# Patient Record
Sex: Male | Born: 1955 | Race: Black or African American | Hispanic: No | Marital: Single | Smoking: Never smoker
Health system: Southern US, Community
[De-identification: ages and names within clinical notes are randomized; demographics above are authoritative.]

## PROBLEM LIST (undated history)

## (undated) DIAGNOSIS — M1990 Unspecified osteoarthritis, unspecified site: Secondary | ICD-10-CM

## (undated) DIAGNOSIS — Z01810 Encounter for preprocedural cardiovascular examination: Secondary | ICD-10-CM

## (undated) DIAGNOSIS — E559 Vitamin D deficiency, unspecified: Secondary | ICD-10-CM

## (undated) DIAGNOSIS — E119 Type 2 diabetes mellitus without complications: Secondary | ICD-10-CM

## (undated) DIAGNOSIS — E042 Nontoxic multinodular goiter: Secondary | ICD-10-CM

## (undated) DIAGNOSIS — I480 Paroxysmal atrial fibrillation: Secondary | ICD-10-CM

## (undated) DIAGNOSIS — E139 Other specified diabetes mellitus without complications: Secondary | ICD-10-CM

## (undated) DIAGNOSIS — IMO0002 Reserved for concepts with insufficient information to code with codable children: Secondary | ICD-10-CM

## (undated) DIAGNOSIS — Z8619 Personal history of other infectious and parasitic diseases: Secondary | ICD-10-CM

## (undated) DIAGNOSIS — I10 Essential (primary) hypertension: Secondary | ICD-10-CM

## (undated) DIAGNOSIS — C73 Malignant neoplasm of thyroid gland: Secondary | ICD-10-CM

## (undated) DIAGNOSIS — N189 Chronic kidney disease, unspecified: Secondary | ICD-10-CM

## (undated) DIAGNOSIS — E785 Hyperlipidemia, unspecified: Secondary | ICD-10-CM

## (undated) DIAGNOSIS — R9430 Abnormal result of cardiovascular function study, unspecified: Secondary | ICD-10-CM

## (undated) DIAGNOSIS — L80 Vitiligo: Secondary | ICD-10-CM

## (undated) DIAGNOSIS — E213 Hyperparathyroidism, unspecified: Secondary | ICD-10-CM

## (undated) DIAGNOSIS — I4891 Unspecified atrial fibrillation: Secondary | ICD-10-CM

## (undated) HISTORY — DX: Encounter for preprocedural cardiovascular examination: Z01.810

## (undated) HISTORY — DX: Personal history of other infectious and parasitic diseases: Z86.19

## (undated) HISTORY — DX: Malignant neoplasm of thyroid gland: C73

## (undated) HISTORY — DX: Hypercalcemia: E83.52

## (undated) HISTORY — DX: Unspecified osteoarthritis, unspecified site: M19.90

## (undated) HISTORY — DX: Hyperlipidemia, unspecified: E78.5

## (undated) HISTORY — DX: Paroxysmal atrial fibrillation: I48.0

## (undated) HISTORY — DX: Vitamin D deficiency, unspecified: E55.9

## (undated) HISTORY — DX: Unspecified atrial fibrillation: I48.91

## (undated) HISTORY — DX: Abnormal result of cardiovascular function study, unspecified: R94.30

## (undated) HISTORY — DX: Type 2 diabetes mellitus without complications: E11.9

## (undated) HISTORY — DX: Chronic kidney disease, unspecified: N18.9

## (undated) HISTORY — DX: Hyperparathyroidism, unspecified: E21.3

## (undated) HISTORY — DX: Vitiligo: L80

## (undated) HISTORY — DX: Reserved for concepts with insufficient information to code with codable children: IMO0002

## (undated) HISTORY — DX: Other specified diabetes mellitus without complications: E13.9

---

## 1997-08-27 ENCOUNTER — Inpatient Hospital Stay (HOSPITAL_COMMUNITY): Admission: AD | Admit: 1997-08-27 | Discharge: 1997-08-30 | Payer: Self-pay | Admitting: Nephrology

## 1997-09-16 ENCOUNTER — Other Ambulatory Visit: Admission: RE | Admit: 1997-09-16 | Discharge: 1997-09-16 | Payer: Self-pay | Admitting: Nephrology

## 1997-12-06 ENCOUNTER — Inpatient Hospital Stay (HOSPITAL_COMMUNITY): Admission: EM | Admit: 1997-12-06 | Discharge: 1997-12-11 | Payer: Self-pay | Admitting: Emergency Medicine

## 1997-12-06 ENCOUNTER — Encounter: Payer: Self-pay | Admitting: Nephrology

## 1997-12-08 ENCOUNTER — Encounter: Payer: Self-pay | Admitting: Nephrology

## 1998-01-15 ENCOUNTER — Ambulatory Visit: Admission: RE | Admit: 1998-01-15 | Discharge: 1998-01-15 | Payer: Self-pay | Admitting: Vascular Surgery

## 1998-06-16 ENCOUNTER — Ambulatory Visit (HOSPITAL_COMMUNITY): Admission: RE | Admit: 1998-06-16 | Discharge: 1998-06-16 | Payer: Self-pay | Admitting: Nephrology

## 1998-06-16 ENCOUNTER — Encounter: Payer: Self-pay | Admitting: Nephrology

## 1998-07-15 ENCOUNTER — Encounter: Payer: Self-pay | Admitting: Thoracic Surgery

## 1998-07-15 ENCOUNTER — Ambulatory Visit (HOSPITAL_COMMUNITY): Admission: RE | Admit: 1998-07-15 | Discharge: 1998-07-15 | Payer: Self-pay | Admitting: Thoracic Surgery

## 1998-12-18 ENCOUNTER — Ambulatory Visit (HOSPITAL_COMMUNITY): Admission: RE | Admit: 1998-12-18 | Discharge: 1998-12-18 | Payer: Self-pay | Admitting: Thoracic Surgery

## 1998-12-18 ENCOUNTER — Encounter: Payer: Self-pay | Admitting: Thoracic Surgery

## 2001-12-20 ENCOUNTER — Encounter: Admission: RE | Admit: 2001-12-20 | Discharge: 2001-12-20 | Payer: Self-pay | Admitting: Nephrology

## 2001-12-20 ENCOUNTER — Encounter: Payer: Self-pay | Admitting: Nephrology

## 2002-02-22 HISTORY — PX: KIDNEY TRANSPLANT: SHX239

## 2003-06-25 ENCOUNTER — Encounter: Admission: RE | Admit: 2003-06-25 | Discharge: 2003-06-25 | Payer: Self-pay | Admitting: Nephrology

## 2003-09-27 ENCOUNTER — Encounter: Admission: RE | Admit: 2003-09-27 | Discharge: 2003-09-27 | Payer: Self-pay | Admitting: Nephrology

## 2005-06-23 ENCOUNTER — Encounter: Payer: Self-pay | Admitting: Nephrology

## 2008-07-03 ENCOUNTER — Encounter: Admission: RE | Admit: 2008-07-03 | Discharge: 2008-07-03 | Payer: Self-pay | Admitting: Nephrology

## 2009-06-26 ENCOUNTER — Encounter: Admission: RE | Admit: 2009-06-26 | Discharge: 2009-06-26 | Payer: Self-pay | Admitting: Nephrology

## 2009-11-19 ENCOUNTER — Emergency Department (HOSPITAL_COMMUNITY): Admission: EM | Admit: 2009-11-19 | Discharge: 2009-11-19 | Payer: Self-pay | Admitting: Emergency Medicine

## 2010-04-08 ENCOUNTER — Other Ambulatory Visit: Payer: Self-pay | Admitting: Surgery

## 2010-04-08 DIAGNOSIS — E041 Nontoxic single thyroid nodule: Secondary | ICD-10-CM

## 2010-04-09 ENCOUNTER — Other Ambulatory Visit: Payer: Self-pay | Admitting: Surgery

## 2010-04-15 ENCOUNTER — Ambulatory Visit
Admission: RE | Admit: 2010-04-15 | Discharge: 2010-04-15 | Disposition: A | Discharge status: Home / Self Care | Payer: Medicare Other | Source: Ambulatory Visit | Attending: Surgery | Admitting: Surgery

## 2010-04-15 ENCOUNTER — Other Ambulatory Visit (HOSPITAL_COMMUNITY)
Admission: RE | Admit: 2010-04-15 | Discharge: 2010-04-15 | Disposition: A | Discharge status: Home / Self Care | Payer: Self-pay | Source: Ambulatory Visit | Attending: Interventional Radiology | Admitting: Interventional Radiology

## 2010-04-15 ENCOUNTER — Ambulatory Visit
Admission: RE | Admit: 2010-04-15 | Discharge: 2010-04-15 | Disposition: A | Discharge status: Home / Self Care | Payer: Self-pay | Source: Ambulatory Visit | Attending: Surgery | Admitting: Surgery

## 2010-04-15 DIAGNOSIS — E041 Nontoxic single thyroid nodule: Secondary | ICD-10-CM

## 2010-04-16 ENCOUNTER — Other Ambulatory Visit: Payer: Self-pay | Admitting: Surgery

## 2010-04-16 DIAGNOSIS — E042 Nontoxic multinodular goiter: Secondary | ICD-10-CM

## 2010-04-16 DIAGNOSIS — R229 Localized swelling, mass and lump, unspecified: Secondary | ICD-10-CM

## 2010-04-16 DIAGNOSIS — E049 Nontoxic goiter, unspecified: Secondary | ICD-10-CM

## 2010-04-21 ENCOUNTER — Other Ambulatory Visit: Payer: Medicare Other

## 2010-04-22 ENCOUNTER — Ambulatory Visit: Admission: RE | Admit: 2010-04-22 | Payer: Medicare Other | Source: Ambulatory Visit

## 2010-04-22 ENCOUNTER — Other Ambulatory Visit: Payer: Self-pay | Admitting: Interventional Radiology

## 2010-04-22 ENCOUNTER — Other Ambulatory Visit: Payer: Self-pay | Admitting: Surgery

## 2010-04-22 ENCOUNTER — Ambulatory Visit
Admission: RE | Admit: 2010-04-22 | Discharge: 2010-04-22 | Disposition: A | Discharge status: Home / Self Care | Payer: Medicare Other | Source: Ambulatory Visit | Attending: Surgery | Admitting: Surgery

## 2010-04-22 DIAGNOSIS — E041 Nontoxic single thyroid nodule: Secondary | ICD-10-CM

## 2010-04-27 ENCOUNTER — Ambulatory Visit
Admission: RE | Admit: 2010-04-27 | Discharge: 2010-04-27 | Disposition: A | Discharge status: Home / Self Care | Payer: Medicare Other | Source: Ambulatory Visit | Attending: Surgery | Admitting: Surgery

## 2010-04-27 DIAGNOSIS — R229 Localized swelling, mass and lump, unspecified: Secondary | ICD-10-CM

## 2010-04-27 DIAGNOSIS — E049 Nontoxic goiter, unspecified: Secondary | ICD-10-CM

## 2010-04-27 MED ORDER — GADOBENATE DIMEGLUMINE 529 MG/ML IV SOLN
16.0000 mL | Freq: Once | INTRAVENOUS | Status: AC | PRN
  Administered 2010-04-27: 16 mL via INTRAVENOUS

## 2011-03-01 ENCOUNTER — Encounter (INDEPENDENT_AMBULATORY_CARE_PROVIDER_SITE_OTHER): Payer: Self-pay

## 2011-05-05 DIAGNOSIS — I10 Essential (primary) hypertension: Secondary | ICD-10-CM | POA: Diagnosis not present

## 2011-05-05 DIAGNOSIS — Z940 Kidney transplant status: Secondary | ICD-10-CM | POA: Diagnosis not present

## 2011-05-05 DIAGNOSIS — E041 Nontoxic single thyroid nodule: Secondary | ICD-10-CM | POA: Diagnosis not present

## 2011-05-06 DIAGNOSIS — Z940 Kidney transplant status: Secondary | ICD-10-CM | POA: Diagnosis not present

## 2011-05-06 DIAGNOSIS — E785 Hyperlipidemia, unspecified: Secondary | ICD-10-CM | POA: Diagnosis not present

## 2011-05-06 DIAGNOSIS — E119 Type 2 diabetes mellitus without complications: Secondary | ICD-10-CM | POA: Diagnosis not present

## 2011-05-06 DIAGNOSIS — N2581 Secondary hyperparathyroidism of renal origin: Secondary | ICD-10-CM | POA: Diagnosis not present

## 2011-07-07 DIAGNOSIS — E041 Nontoxic single thyroid nodule: Secondary | ICD-10-CM | POA: Diagnosis not present

## 2011-08-05 ENCOUNTER — Encounter (INDEPENDENT_AMBULATORY_CARE_PROVIDER_SITE_OTHER): Payer: Self-pay | Admitting: Surgery

## 2011-08-05 ENCOUNTER — Ambulatory Visit (INDEPENDENT_AMBULATORY_CARE_PROVIDER_SITE_OTHER): Payer: Medicare Other | Admitting: Surgery

## 2011-08-05 VITALS — BP 102/60 | HR 45 | Temp 95.9°F | Resp 18 | Ht 74.0 in | Wt 186.0 lb

## 2011-08-05 DIAGNOSIS — D449 Neoplasm of uncertain behavior of unspecified endocrine gland: Secondary | ICD-10-CM | POA: Diagnosis not present

## 2011-08-05 DIAGNOSIS — D440 Neoplasm of uncertain behavior of thyroid gland: Secondary | ICD-10-CM | POA: Insufficient documentation

## 2011-08-05 NOTE — Patient Instructions (Signed)
[facility] [phone]  THYROID & PARATHYROID SURGERY -- POST OP INSTRUCTIONS  Always review your discharge instruction sheet from the facility where your surgery was performed.  1. A prescription for pain medication may be given to you upon discharge.  Take your pain medication as prescribed, if needed.  If narcotic pain medicine is not needed, then you may take acetaminophen (Tylenol) or ibuprofen (Advil) as needed. 2. Take your usually prescribed medications unless otherwise directed. 3. If you need a refill on your pain medication, please contact your pharmacy. They will contact our office to request authorization.  Prescriptions will not be processed after 5 pm or on weekends. 4. Start with a light diet upon arrival home, such as soup and crackers, etc.  Be sure to drink pleny of fluids daily.  Resume your normal diet the day after surgery. 5. Most patients will experience some swelling and bruising on the chest and neck area.  Ice packs will help.  Swelling and bruising can take several days to resolve.  6. It is common to experience some constipation if taking pain medication after surgery.  Increasing fluid intake and taking a stool softener will usually help or prevent this problem.  A mild laxative (Milk of Magnesia or Miralax) should be taken according to package directions if there are no bowel movements after 48 hours. 7. You may remove your bandages 24-48 hours after surgery, and you may shower at that time.  You have steri-strips (small skin tapes) in place directly over the incision.  These strips should be left on the skin for 7-10 days and then removed. 8. You may resume regular (light) daily activities beginning the next day-such as daily self-care, walking, climbing stairs-gradually increasing activities as tolerated.  You may have sexual intercourse when it is comfortable.  Refrain from any heavy lifting or straining until approved by your doctor.  You may drive  when you no longer are taking prescription pain medication, you can comfortably wear a seatbelt, and you can safely maneuver your car and apply brakes. 9. You should see your doctor in the office for a follow-up appointment approximately two weeks after your surgery.  Make sure that you call for this appointment within a day or two after you arrive home to insure a convenient appointment time.  WHEN TO CALL YOUR DOCTOR: 1. Fever over 101.5 2. Inability to urinate 3. Nausea and/or vomiting - persistent 4. Extreme swelling or bruising 5. Continued bleeding from incision 6. Increased pain, redness, or drainage from the incision 7. Difficulty swallowing or breathing 8. Muscle cramping or spasms 9. Numbness or tingling in hands or feet or around lips  The clinic staff is available to answer your questions during regular business hours.  Please don't hesitate to call and ask to speak to one of the nurses if you have concerns.  [email or web address]   

## 2011-08-05 NOTE — Progress Notes (Signed)
Chief Complaint  Patient presents with  . Thyroid Nodule    follicular nodule with atypia - referral from Dr. Balan and Dr. Dunham    HISTORY: Patient is a 56-year-old black male previously evaluated in my practice for a left-sided thyroid nodule. 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 In March 2012 the patient was recommended for total thyroidectomy. Patient delayed surgery. He was seen by his nephrologist to also recommended thyroidectomy. Patient was eventually referred to an endocrinologist who also recommended thyroidectomy. Patient now returns for followup and to discuss thyroid surgery.  Past Medical History  Diagnosis Date  . Chronic kidney disease     dialysis; kidney transplant     Current Outpatient Prescriptions  Medication Sig Dispense Refill  . amLODipine (NORVASC) 10 MG tablet Take 10 mg by mouth 2 (two) times daily.      . aspirin 81 MG tablet Take 81 mg by mouth daily.      . atorvastatin (LIPITOR) 80 MG tablet Take 80 mg by mouth daily.      . cloNIDine (CATAPRES) 0.3 MG tablet Take 0.3 mg by mouth 2 (two) times daily.      . cycloSPORINE modified (NEORAL) 100 MG capsule Take 100 mg by mouth 2 (two) times daily.      . enalapril (VASOTEC) 10 MG tablet Take 10 mg by mouth daily.      . mycophenolate (MYFORTIC) 360 MG TBEC Take 360 mg by mouth 2 (two) times daily.         Not on File   History reviewed. No pertinent family history.   History   Social History  . Marital Status: Single    Spouse Name: N/A    Number of Children: N/A  . Years of Education: N/A   Social History Main Topics  . Smoking status: Never Smoker   . Smokeless tobacco: None  . Alcohol Use: No  . Drug Use: No  . Sexually Active:    Other Topics Concern  . None   Social History Narrative  . None     REVIEW OF SYSTEMS - PERTINENT POSITIVES ONLY: Denies tremor. Denies palpitation. Denies  dysphagia. Denies new masses. Denies discomfort.  EXAM: Filed Vitals:   08/05/11 0933  BP: 102/60  Pulse: 45  Temp: 95.9 F (35.5 C)  Resp: 18    HEENT: normocephalic; pupils equal and reactive; sclerae clear; dentition good; mucous membranes moist NECK:  Palpable left thyroid nodule, approximately 2 cm, nontender, mobile with swallowing; right thyroid lobe nodular but without discrete or dominant mass; symmetric on extension; no palpable anterior or posterior cervical lymphadenopathy; no supraclavicular masses; no tenderness CHEST: clear to auscultation bilaterally without rales, rhonchi, or wheezes CARDIAC: regular rate and rhythm without significant murmur; peripheral pulses are full ABDOMEN: soft without distension; bowel sounds present; no mass; no hepatosplenomegaly; no hernia EXT:  non-tender without edema; no deformity; Vascular access left forearm NEURO: no gross focal deficits; no sign of tremor   LABORATORY RESULTS: See [facility] (CHL-Epic) for most recent results   RADIOLOGY RESULTS: See [facility] (CHL-Epic) for most recent results   IMPRESSION: Dominant left thyroid nodule, 2.6 cm, with 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 We reviewed the procedure of total thyroidectomy. We discussed specific risks and benefits including the risk of recurrent laryngeal nerve injury and injury to parathyroid glands. We discussed   the postoperative recovery to be anticipated. We discussed the need for lifelong thyroid hormone replacement. We discussed the potential need for radioactive iodine treatment. Patient understands and wishes to proceed.  The risks and benefits of the procedure have been discussed at length with the patient.  The patient understands the proposed procedure, potential alternative treatments, and the course of recovery to be expected.  All of  the patient's questions have been answered at this time.  The patient wishes to proceed with surgery.  Endrit Gittins M. Terriana Barreras, MD, FACS General & Endocrine Surgery [facility]   Visit Diagnoses: 1. Neoplasm of uncertain behavior of thyroid gland, left lobe     Primary Care Physician: DUNHAM,CYNTHIA B, MD  Endocrine:  Dr. Bindubal Balan  

## 2011-08-06 ENCOUNTER — Telehealth (INDEPENDENT_AMBULATORY_CARE_PROVIDER_SITE_OTHER): Payer: Self-pay | Admitting: General Surgery

## 2011-08-06 ENCOUNTER — Encounter (HOSPITAL_COMMUNITY)
Admission: RE | Admit: 2011-08-06 | Discharge: 2011-08-06 | Disposition: A | Discharge status: Home / Self Care | Payer: Medicare Other | Source: Ambulatory Visit | Attending: Surgery | Admitting: Surgery

## 2011-08-06 ENCOUNTER — Other Ambulatory Visit: Payer: Self-pay

## 2011-08-06 ENCOUNTER — Encounter (HOSPITAL_COMMUNITY): Payer: Self-pay

## 2011-08-06 ENCOUNTER — Encounter (HOSPITAL_COMMUNITY): Payer: Self-pay | Admitting: Pharmacy Technician

## 2011-08-06 ENCOUNTER — Ambulatory Visit (HOSPITAL_COMMUNITY)
Admission: RE | Admit: 2011-08-06 | Discharge: 2011-08-06 | Disposition: A | Discharge status: Home / Self Care | Payer: Medicare Other | Source: Ambulatory Visit | Attending: Surgery | Admitting: Surgery

## 2011-08-06 DIAGNOSIS — Z01818 Encounter for other preprocedural examination: Secondary | ICD-10-CM | POA: Insufficient documentation

## 2011-08-06 DIAGNOSIS — Z01810 Encounter for preprocedural cardiovascular examination: Secondary | ICD-10-CM | POA: Insufficient documentation

## 2011-08-06 DIAGNOSIS — Z01812 Encounter for preprocedural laboratory examination: Secondary | ICD-10-CM | POA: Diagnosis not present

## 2011-08-06 DIAGNOSIS — I4891 Unspecified atrial fibrillation: Secondary | ICD-10-CM | POA: Insufficient documentation

## 2011-08-06 DIAGNOSIS — E041 Nontoxic single thyroid nodule: Secondary | ICD-10-CM | POA: Insufficient documentation

## 2011-08-06 DIAGNOSIS — Z01811 Encounter for preprocedural respiratory examination: Secondary | ICD-10-CM | POA: Diagnosis not present

## 2011-08-06 HISTORY — DX: Nontoxic multinodular goiter: E04.2

## 2011-08-06 HISTORY — DX: Essential (primary) hypertension: I10

## 2011-08-06 LAB — CBC
HCT: 42.2 % (ref 39.0–52.0)
Hemoglobin: 14.1 g/dL (ref 13.0–17.0)
MCH: 29.1 pg (ref 26.0–34.0)
MCV: 87 fL (ref 78.0–100.0)
Platelets: 213 10*3/uL (ref 150–400)
RBC: 4.85 MIL/uL (ref 4.22–5.81)

## 2011-08-06 LAB — COMPREHENSIVE METABOLIC PANEL
BUN: 32 mg/dL — ABNORMAL HIGH (ref 6–23)
CO2: 22 mEq/L (ref 19–32)
Calcium: 10.5 mg/dL (ref 8.4–10.5)
Chloride: 107 mEq/L (ref 96–112)
Creatinine, Ser: 1.74 mg/dL — ABNORMAL HIGH (ref 0.50–1.35)
GFR calc Af Amer: 50 mL/min — ABNORMAL LOW (ref >90)
GFR calc non Af Amer: 43 mL/min — ABNORMAL LOW (ref >90)
Glucose, Bld: 96 mg/dL (ref 70–99)
Total Bilirubin: 0.4 mg/dL (ref 0.3–1.2)

## 2011-08-06 LAB — PROTIME-INR
INR: 0.95 (ref 0.00–1.49)
Prothrombin Time: 12.9 seconds (ref 11.6–15.2)

## 2011-08-06 NOTE — Pre-Procedure Instructions (Signed)
Reviewed with patient pre-op instructions using Teach back method. 

## 2011-08-06 NOTE — Pre-Procedure Instructions (Signed)
Talked with Randy [name] to let her know that Dr. Gerrit Friends needs to look at [facility] results

## 2011-08-06 NOTE — Pre-Procedure Instructions (Signed)
Spoke with Dr. Rica Mast and he wants CBC,CMET PT/INR,PTT CXR EKG done on patient for surgery based on patients history.

## 2011-08-06 NOTE — Pre-Procedure Instructions (Signed)
EKG from [facility] 11/09/2002 on chart, EKG from 12/18/1998 and 12/07/1997 from [facility] on chart.

## 2011-08-06 NOTE — Telephone Encounter (Signed)
Randy [name], pre-admission, calling to report pt's EKG shows Atrial Fib.  Per anesthesia, since this is a new cardiac rhythm, he will need cardiac clearance for surgery, currently scheduled for 08/17/11.

## 2011-08-06 NOTE — Patient Instructions (Addendum)
20 Randy [name]  08/06/2011   Your procedure is scheduled on:  Tuesday 08/17/2011 at 130pm  Report to [facility] at 1100 AM.  Call this number if you have problems the morning of surgery: [phone]   Remember:   Do not eat food:After Midnight.  May have clear liquids: up to 4 Hours before arrival-MAY HAVE CLEAR LIQUIDS FROM MIDNIGHT UP UNTIL 0730AM MORNING OF SURGERY THEN NOTHING UNTIL AFTER SURGERY!  Clear liquids include soda, tea, black coffee, apple or grape juice, broth.  Take these medicines the morning of surgery with A SIP OF WATER: Norvasc,Catapress,Cuclosporine,Mycophenolate   Do not wear jewelry  Do not wear lotions, powders, or perfumes.   Do not shave 48 hours prior to surgery. Men may shave face and neck.  Do not bring valuables to the hospital.  Contacts, dentures or bridgework may not be worn into surgery.  Leave suitcase in the car. After surgery it may be brought to your room.  For patients admitted to the hospital, checkout time is 11:00 AM the day of discharge.     Special Instructions: CHG Shower Use Special Wash: 1/2 bottle night before surgery and 1/2 bottle morning of surgery.   Please read over the following fact sheets that you were given: MRSA Information, Sleep apnea sheet, Incentive Spirometry sheet,                   If you have any questions ,please call me Telford Nab.Aleksia Freiman,RN,BSN at [phone]

## 2011-08-06 NOTE — Pre-Procedure Instructions (Signed)
Spoke with Dr. Acey Lav about EKG showing A-Fib. -wants patient to have Cardiac workup before surgery based on history.

## 2011-08-06 NOTE — Telephone Encounter (Signed)
Pt notified of abn EKG and need for cardiac clearance. Pt has never seen cardiologist. Appt made for 6-17 arrive at 11:45am with Dr Myrtis Ser at [facility] cardiology. Pt advised proceeding with his surgery is dependent on what Dr Myrtis Ser recommends.

## 2011-08-09 ENCOUNTER — Ambulatory Visit (INDEPENDENT_AMBULATORY_CARE_PROVIDER_SITE_OTHER): Payer: Medicare Other | Admitting: Cardiology

## 2011-08-09 ENCOUNTER — Encounter: Payer: Self-pay | Admitting: Cardiology

## 2011-08-09 VITALS — BP 130/68 | HR 73 | Resp 18 | Ht 74.0 in | Wt 188.8 lb

## 2011-08-09 DIAGNOSIS — E042 Nontoxic multinodular goiter: Secondary | ICD-10-CM

## 2011-08-09 DIAGNOSIS — I10 Essential (primary) hypertension: Secondary | ICD-10-CM

## 2011-08-09 DIAGNOSIS — N189 Chronic kidney disease, unspecified: Secondary | ICD-10-CM | POA: Insufficient documentation

## 2011-08-09 DIAGNOSIS — Z01810 Encounter for preprocedural cardiovascular examination: Secondary | ICD-10-CM | POA: Diagnosis not present

## 2011-08-09 DIAGNOSIS — D440 Neoplasm of uncertain behavior of thyroid gland: Secondary | ICD-10-CM

## 2011-08-09 DIAGNOSIS — D449 Neoplasm of uncertain behavior of unspecified endocrine gland: Secondary | ICD-10-CM

## 2011-08-09 NOTE — Patient Instructions (Addendum)
You are cleared for your upcoming surgery by Dr Gerrit Friends from a cardiac standpoint  No f/u scheduled with cardiology

## 2011-08-09 NOTE — Assessment & Plan Note (Signed)
The patient has had a renal transplant and is followed by Dr.Dunham. I do not know if any of his medications need to be adjusted for his surgery. He is on antirejection medicines including low-dose prednisone.

## 2011-08-09 NOTE — Assessment & Plan Note (Signed)
Blood pressures control. No change in therapy. 

## 2011-08-09 NOTE — Assessment & Plan Note (Signed)
The patient has no known cardiac disease. He is having no significant symptoms.His EKG reveals no diagnostic abnormalities. He had a complete cardiac workup in 2004 before his renal transplant. He has not had a recent MI and has no signs of heart failure. He has not had any significant arrhythmias. He is stable from the cardiac viewpoint. Further cardiac workup is not needed. He is cleared to have thyroid surgery.

## 2011-08-09 NOTE — Progress Notes (Signed)
HPI   The patient is seen today in consultation for cardiac clearance for him to have thyroid surgery. He is a pleasant and active 56 year old gentleman. 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 This was done in 2004. He had a full cardiac evaluation at that time and had no diagnoses of any significant cardiac disease ( per the patient). He has not had any significant cardiac symptoms over time. There has been no significant cardiac testing done over time since his renal transplant. He did some work this past Saturday and felt no particular problems. He's not having chest pain or shortness of breath.  No Known Allergies  Current Outpatient Prescriptions  Medication Sig Dispense Refill  . amLODipine (NORVASC) 10 MG tablet Take 10 mg by mouth 2 (two) times daily.      Marland Kitchen aspirin 81 MG tablet Take 81 mg by mouth daily.      Marland Kitchen atorvastatin (LIPITOR) 80 MG tablet Take 80 mg by mouth daily.      . cloNIDine (CATAPRES) 0.3 MG tablet Take 0.3 mg by mouth 2 (two) times daily.      . cycloSPORINE modified (NEORAL) 100 MG capsule Take 100 mg by mouth 2 (two) times daily.      . enalapril (VASOTEC) 5 MG tablet Take 5 mg by mouth at bedtime.       Marland Kitchen glipiZIDE (GLUCOTROL XL) 5 MG 24 hr tablet Take 5 mg by mouth daily.      . mycophenolate (MYFORTIC) 360 MG TBEC Take 720 mg by mouth 2 (two) times daily.       . predniSONE (DELTASONE) 5 MG tablet Take 5 mg by mouth daily.        History   Social History  . Marital Status: Single    Spouse Name: N/A    Number of Children: N/A  . Years of Education: N/A   Occupational History  . Not on file.   Social History Main Topics  . Smoking status: Never Smoker   . Smokeless tobacco: Not on file  . Alcohol Use: No  . Drug Use: No  . Sexually Active:    Other Topics Concern  . Not on file   Social History Narrative  . No narrative on file    No family history on file.  Past Medical History  Diagnosis Date  . Chronic kidney  disease     dialysis; kidney transplant  2004  . Hypertension   . Multiple thyroid nodules   . Preop cardiovascular exam     Preop clearance for thyroid surgery June, 2013    Past Surgical History  Procedure Date  . Kidney transplant 2004    ROS  Patient denies fever, chills, headache, sweats, rash, change in vision, change in hearing, chest pain, cough, nausea vomiting, urinary symptoms. All other systems are reviewed and are negative.  PHYSICAL EXAM  Patient is stable. He is oriented to person time and place. Affect is normal. There is no jugulovenous distention. There no carotid bruits. Lungs are clear. Respiratory effort is nonlabored. Cardiac exam reveals S1 and S2. There no clicks or significant murmurs. The abdomen is soft. There is no peripheral edema. There no musculoskeletal deformities. There are no skin rashes.  Filed Vitals:   08/09/11 1113  BP: 130/68  Pulse: 73  Resp: 18  Height: 6\' 2"  (1.88 m)  Weight: 188 lb 12.8 oz (85.639 kg)   EKG is done today and reviewed by me. There  are minor nonspecific ST-T wave changes.  ASSESSMENT & PLAN

## 2011-08-09 NOTE — Assessment & Plan Note (Signed)
Patient needs to have surgery for this. He is now cleared from the cardiac viewpoint.

## 2011-08-10 NOTE — Pre-Procedure Instructions (Signed)
Patient called and informed of new time of surgery -to report at Short Stay Dept.at [facility] at 0930 am -nothing to eat or drink after midnight and surgery is at 1200 noon-Reviewed pre-op instructions with patient using Teach Back method.

## 2011-08-11 ENCOUNTER — Other Ambulatory Visit (INDEPENDENT_AMBULATORY_CARE_PROVIDER_SITE_OTHER): Payer: Self-pay

## 2011-08-11 ENCOUNTER — Telehealth (INDEPENDENT_AMBULATORY_CARE_PROVIDER_SITE_OTHER): Payer: Self-pay

## 2011-08-11 DIAGNOSIS — C73 Malignant neoplasm of thyroid gland: Secondary | ICD-10-CM | POA: Diagnosis not present

## 2011-08-11 DIAGNOSIS — Z940 Kidney transplant status: Secondary | ICD-10-CM | POA: Diagnosis not present

## 2011-08-11 DIAGNOSIS — E559 Vitamin D deficiency, unspecified: Secondary | ICD-10-CM | POA: Diagnosis not present

## 2011-08-11 DIAGNOSIS — E785 Hyperlipidemia, unspecified: Secondary | ICD-10-CM | POA: Diagnosis not present

## 2011-08-11 DIAGNOSIS — E119 Type 2 diabetes mellitus without complications: Secondary | ICD-10-CM | POA: Diagnosis not present

## 2011-08-11 DIAGNOSIS — I10 Essential (primary) hypertension: Secondary | ICD-10-CM | POA: Diagnosis not present

## 2011-08-11 NOTE — Telephone Encounter (Signed)
Lab slip mailed to pt to go to [facility] for post op labs.

## 2011-08-17 ENCOUNTER — Encounter (HOSPITAL_COMMUNITY): Payer: Self-pay | Admitting: Anesthesiology

## 2011-08-17 ENCOUNTER — Ambulatory Visit (HOSPITAL_COMMUNITY)
Admission: RE | Admit: 2011-08-17 | Discharge: 2011-08-18 | Disposition: A | Discharge status: Home / Self Care | Payer: Medicare Other | Source: Ambulatory Visit | Attending: Surgery | Admitting: Surgery

## 2011-08-17 ENCOUNTER — Encounter (HOSPITAL_COMMUNITY)
Admission: RE | Disposition: A | Discharge status: Home / Self Care | Payer: Self-pay | Source: Ambulatory Visit | Attending: Surgery

## 2011-08-17 ENCOUNTER — Ambulatory Visit (HOSPITAL_COMMUNITY): Payer: Medicare Other | Admitting: Anesthesiology

## 2011-08-17 ENCOUNTER — Encounter (HOSPITAL_COMMUNITY): Payer: Self-pay | Admitting: *Deleted

## 2011-08-17 DIAGNOSIS — E041 Nontoxic single thyroid nodule: Principal | ICD-10-CM | POA: Insufficient documentation

## 2011-08-17 DIAGNOSIS — Z79899 Other long term (current) drug therapy: Secondary | ICD-10-CM | POA: Diagnosis not present

## 2011-08-17 DIAGNOSIS — Z01812 Encounter for preprocedural laboratory examination: Secondary | ICD-10-CM | POA: Diagnosis not present

## 2011-08-17 DIAGNOSIS — Z940 Kidney transplant status: Secondary | ICD-10-CM | POA: Diagnosis not present

## 2011-08-17 DIAGNOSIS — Z992 Dependence on renal dialysis: Secondary | ICD-10-CM | POA: Diagnosis not present

## 2011-08-17 DIAGNOSIS — N189 Chronic kidney disease, unspecified: Secondary | ICD-10-CM | POA: Diagnosis not present

## 2011-08-17 DIAGNOSIS — D449 Neoplasm of uncertain behavior of unspecified endocrine gland: Secondary | ICD-10-CM | POA: Diagnosis not present

## 2011-08-17 DIAGNOSIS — C73 Malignant neoplasm of thyroid gland: Secondary | ICD-10-CM | POA: Diagnosis not present

## 2011-08-17 DIAGNOSIS — D440 Neoplasm of uncertain behavior of thyroid gland: Secondary | ICD-10-CM

## 2011-08-17 HISTORY — PX: THYROIDECTOMY: SHX17

## 2011-08-17 LAB — GLUCOSE, CAPILLARY
Glucose-Capillary: 89 mg/dL (ref 70–99)
Glucose-Capillary: 158 mg/dL — ABNORMAL HIGH (ref 70–99)

## 2011-08-17 SURGERY — THYROIDECTOMY
Anesthesia: General | Site: Neck | Wound class: Clean

## 2011-08-17 MED ORDER — PROMETHAZINE HCL 25 MG/ML IJ SOLN: 6.2500 mg | INTRAMUSCULAR | Status: DC | PRN

## 2011-08-17 MED ORDER — LACTATED RINGERS IV SOLN
INTRAVENOUS | Status: DC
  Administered 2011-08-17: 1000 mL via INTRAVENOUS

## 2011-08-17 MED ORDER — LIDOCAINE HCL (CARDIAC) 20 MG/ML IV SOLN
INTRAVENOUS | Status: DC | PRN
  Administered 2011-08-17: 60 mg via INTRAVENOUS

## 2011-08-17 MED ORDER — CALCIUM CARBONATE 1250 (500 CA) MG PO TABS
1250.0000 mg | ORAL_TABLET | Freq: Three times a day (TID) | ORAL | Status: DC
  Administered 2011-08-17 (×2): 1250 mg via ORAL
  Filled 2011-08-17 (×5): qty 1

## 2011-08-17 MED ORDER — ACETAMINOPHEN 10 MG/ML IV SOLN
INTRAVENOUS | Status: DC | PRN
  Administered 2011-08-17: 1000 mg via INTRAVENOUS

## 2011-08-17 MED ORDER — HETASTARCH-ELECTROLYTES 6 % IV SOLN
INTRAVENOUS | Status: DC | PRN
  Administered 2011-08-17: 13:00:00 via INTRAVENOUS

## 2011-08-17 MED ORDER — ACETAMINOPHEN 10 MG/ML IV SOLN
INTRAVENOUS | Status: AC
  Filled 2011-08-17: qty 100

## 2011-08-17 MED ORDER — 0.9 % SODIUM CHLORIDE (POUR BTL) OPTIME
TOPICAL | Status: DC | PRN
  Administered 2011-08-17: 1000 mL

## 2011-08-17 MED ORDER — NEOSTIGMINE METHYLSULFATE 1 MG/ML IJ SOLN
INTRAMUSCULAR | Status: DC | PRN
  Administered 2011-08-17: 4 mg via INTRAVENOUS

## 2011-08-17 MED ORDER — ACETAMINOPHEN 325 MG PO TABS: 650.0000 mg | ORAL_TABLET | ORAL | Status: DC | PRN

## 2011-08-17 MED ORDER — CLONIDINE HCL 0.3 MG PO TABS
0.3000 mg | ORAL_TABLET | Freq: Two times a day (BID) | ORAL | Status: DC
  Administered 2011-08-17: 0.3 mg via ORAL
  Filled 2011-08-17 (×3): qty 1

## 2011-08-17 MED ORDER — HYDROCORTISONE SOD SUCCINATE 1000 MG IJ SOLR
INTRAMUSCULAR | Status: DC | PRN
  Administered 2011-08-17: 100 mg via INTRAVENOUS

## 2011-08-17 MED ORDER — PREDNISONE 5 MG PO TABS
5.0000 mg | ORAL_TABLET | Freq: Every day | ORAL | Status: DC
  Administered 2011-08-17: 5 mg via ORAL
  Filled 2011-08-17 (×2): qty 1

## 2011-08-17 MED ORDER — GLIPIZIDE ER 5 MG PO TB24
5.0000 mg | ORAL_TABLET | Freq: Every day | ORAL | Status: DC
  Administered 2011-08-17: 5 mg via ORAL
  Filled 2011-08-17 (×2): qty 1

## 2011-08-17 MED ORDER — CEFAZOLIN SODIUM 1-5 GM-% IV SOLN
1.0000 g | INTRAVENOUS | Status: AC
  Administered 2011-08-17: 2 g via INTRAVENOUS

## 2011-08-17 MED ORDER — ENALAPRIL MALEATE 5 MG PO TABS
5.0000 mg | ORAL_TABLET | Freq: Every day | ORAL | Status: DC
  Administered 2011-08-17: 5 mg via ORAL
  Filled 2011-08-17 (×2): qty 1

## 2011-08-17 MED ORDER — PHENYLEPHRINE HCL 10 MG/ML IJ SOLN
INTRAMUSCULAR | Status: DC | PRN
  Administered 2011-08-17 (×5): 80 ug via INTRAVENOUS

## 2011-08-17 MED ORDER — LACTATED RINGERS IV SOLN: INTRAVENOUS | Status: DC

## 2011-08-17 MED ORDER — CISATRACURIUM BESYLATE (PF) 10 MG/5ML IV SOLN
INTRAVENOUS | Status: DC | PRN
  Administered 2011-08-17: 6 mg via INTRAVENOUS
  Administered 2011-08-17 (×2): 4 mg via INTRAVENOUS
  Administered 2011-08-17: 2 mg via INTRAVENOUS

## 2011-08-17 MED ORDER — CEFAZOLIN SODIUM-DEXTROSE 2-3 GM-% IV SOLR
INTRAVENOUS | Status: AC
  Filled 2011-08-17: qty 50

## 2011-08-17 MED ORDER — HYDROCORTISONE SOD SUCCINATE 100 MG IJ SOLR
INTRAMUSCULAR | Status: AC
  Filled 2011-08-17: qty 2

## 2011-08-17 MED ORDER — MYCOPHENOLATE SODIUM 180 MG PO TBEC
720.0000 mg | DELAYED_RELEASE_TABLET | Freq: Two times a day (BID) | ORAL | Status: DC
  Administered 2011-08-17: 720 mg via ORAL
  Filled 2011-08-17 (×3): qty 4

## 2011-08-17 MED ORDER — MIDAZOLAM HCL 5 MG/5ML IJ SOLN
INTRAMUSCULAR | Status: DC | PRN
  Administered 2011-08-17: 2 mg via INTRAVENOUS

## 2011-08-17 MED ORDER — CYCLOSPORINE MODIFIED (NEORAL) 100 MG PO CAPS
100.0000 mg | ORAL_CAPSULE | Freq: Two times a day (BID) | ORAL | Status: DC
Start: 2011-08-17 — End: 2011-08-18
  Administered 2011-08-17: 100 mg via ORAL
  Filled 2011-08-17 (×3): qty 1

## 2011-08-17 MED ORDER — HYDROCODONE-ACETAMINOPHEN 5-325 MG PO TABS
1.0000 | ORAL_TABLET | ORAL | Status: DC | PRN
  Administered 2011-08-17: 1 via ORAL
  Administered 2011-08-18: 2 via ORAL
  Filled 2011-08-17: qty 1
  Filled 2011-08-17: qty 2

## 2011-08-17 MED ORDER — AMLODIPINE BESYLATE 10 MG PO TABS
10.0000 mg | ORAL_TABLET | Freq: Two times a day (BID) | ORAL | Status: DC
  Administered 2011-08-17: 10 mg via ORAL
  Filled 2011-08-17 (×3): qty 1

## 2011-08-17 MED ORDER — ONDANSETRON HCL 4 MG PO TABS: 4.0000 mg | ORAL_TABLET | Freq: Four times a day (QID) | ORAL | Status: DC | PRN

## 2011-08-17 MED ORDER — KCL IN DEXTROSE-NACL 20-5-0.45 MEQ/L-%-% IV SOLN
INTRAVENOUS | Status: DC
  Administered 2011-08-17 – 2011-08-18 (×2): via INTRAVENOUS
  Filled 2011-08-17 (×2): qty 1000

## 2011-08-17 MED ORDER — EPHEDRINE SULFATE 50 MG/ML IJ SOLN
INTRAMUSCULAR | Status: DC | PRN
  Administered 2011-08-17 (×2): 5 mg via INTRAVENOUS

## 2011-08-17 MED ORDER — HYDROMORPHONE HCL PF 1 MG/ML IJ SOLN
0.2500 mg | INTRAMUSCULAR | Status: DC | PRN
  Administered 2011-08-17 (×2): 0.5 mg via INTRAVENOUS

## 2011-08-17 MED ORDER — PROPOFOL 10 MG/ML IV EMUL
INTRAVENOUS | Status: DC | PRN
  Administered 2011-08-17: 180 mg via INTRAVENOUS

## 2011-08-17 MED ORDER — ONDANSETRON HCL 4 MG/2ML IJ SOLN: 4.0000 mg | Freq: Four times a day (QID) | INTRAMUSCULAR | Status: DC | PRN

## 2011-08-17 MED ORDER — LIDOCAINE HCL 4 % MT SOLN
OROMUCOSAL | Status: DC | PRN
  Administered 2011-08-17: 4 mL via TOPICAL

## 2011-08-17 MED ORDER — SODIUM CHLORIDE 0.9 % IV SOLN
INTRAVENOUS | Status: DC | PRN
  Administered 2011-08-17: 12:00:00 via INTRAVENOUS

## 2011-08-17 MED ORDER — GLYCOPYRROLATE 0.2 MG/ML IJ SOLN
INTRAMUSCULAR | Status: DC | PRN
  Administered 2011-08-17: 0.6 mg via INTRAVENOUS

## 2011-08-17 MED ORDER — HYDROMORPHONE HCL PF 1 MG/ML IJ SOLN
1.0000 mg | INTRAMUSCULAR | Status: DC | PRN
  Administered 2011-08-17 (×2): 1 mg via INTRAVENOUS
  Filled 2011-08-17 (×2): qty 1

## 2011-08-17 MED ORDER — ONDANSETRON HCL 4 MG/2ML IJ SOLN
INTRAMUSCULAR | Status: DC | PRN
  Administered 2011-08-17: 4 mg via INTRAVENOUS

## 2011-08-17 MED ORDER — SUFENTANIL CITRATE 50 MCG/ML IV SOLN
INTRAVENOUS | Status: DC | PRN
  Administered 2011-08-17: 10 ug via INTRAVENOUS
  Administered 2011-08-17: 5 ug via INTRAVENOUS

## 2011-08-17 MED ORDER — HYDROMORPHONE HCL PF 1 MG/ML IJ SOLN
INTRAMUSCULAR | Status: AC
  Filled 2011-08-17: qty 1

## 2011-08-17 MED ORDER — SUCCINYLCHOLINE CHLORIDE 20 MG/ML IJ SOLN
INTRAMUSCULAR | Status: DC | PRN
  Administered 2011-08-17: 100 mg via INTRAVENOUS

## 2011-08-17 SURGICAL SUPPLY — 38 items
ATTRACTOMAT 16X20 MAGNETIC DRP (DRAPES) ×2 IMPLANT
BENZOIN TINCTURE PRP APPL 2/3 (GAUZE/BANDAGES/DRESSINGS) ×2 IMPLANT
BLADE HEX COATED 2.75 (ELECTRODE) ×2 IMPLANT
BLADE SURG 15 STRL LF DISP TIS (BLADE) ×1 IMPLANT
BLADE SURG 15 STRL SS (BLADE) ×1
CANISTER SUCTION 2500CC (MISCELLANEOUS) ×2 IMPLANT
CHLORAPREP W/TINT 10.5 ML (MISCELLANEOUS) ×2 IMPLANT
CLIP TI MEDIUM 6 (CLIP) ×6 IMPLANT
CLIP TI WIDE RED SMALL 6 (CLIP) ×6 IMPLANT
CLOTH BEACON ORANGE TIMEOUT ST (SAFETY) ×2 IMPLANT
DISSECTOR ROUND CHERRY 3/8 STR (MISCELLANEOUS) IMPLANT
DRAPE PED LAPAROTOMY (DRAPES) ×2 IMPLANT
DRESSING SURGICEL FIBRLLR 1X2 (HEMOSTASIS) ×1 IMPLANT
DRSG SURGICEL FIBRILLAR 1X2 (HEMOSTASIS) ×2
ELECT REM PT RETURN 9FT ADLT (ELECTROSURGICAL) ×2
ELECTRODE REM PT RTRN 9FT ADLT (ELECTROSURGICAL) ×1 IMPLANT
GAUZE SPONGE 4X4 16PLY XRAY LF (GAUZE/BANDAGES/DRESSINGS) ×2 IMPLANT
GLOVE SURG ORTHO 8.0 STRL STRW (GLOVE) ×2 IMPLANT
GOWN STRL NON-REIN LRG LVL3 (GOWN DISPOSABLE) ×2 IMPLANT
GOWN STRL REIN XL XLG (GOWN DISPOSABLE) ×4 IMPLANT
KIT BASIN OR (CUSTOM PROCEDURE TRAY) ×2 IMPLANT
NS IRRIG 1000ML POUR BTL (IV SOLUTION) ×2 IMPLANT
PACK BASIC VI WITH GOWN DISP (CUSTOM PROCEDURE TRAY) ×2 IMPLANT
PENCIL BUTTON HOLSTER BLD 10FT (ELECTRODE) ×2 IMPLANT
SHEARS HARMONIC 9CM CVD (BLADE) ×2 IMPLANT
SPONGE GAUZE 4X4 12PLY (GAUZE/BANDAGES/DRESSINGS) ×2 IMPLANT
STAPLER VISISTAT 35W (STAPLE) ×2 IMPLANT
STRIP CLOSURE SKIN 1/2X4 (GAUZE/BANDAGES/DRESSINGS) ×2 IMPLANT
SUT MNCRL AB 4-0 PS2 18 (SUTURE) ×2 IMPLANT
SUT SILK 2 0 (SUTURE) ×1
SUT SILK 2-0 18XBRD TIE 12 (SUTURE) ×1 IMPLANT
SUT SILK 3 0 (SUTURE)
SUT SILK 3-0 18XBRD TIE 12 (SUTURE) IMPLANT
SUT VIC AB 3-0 SH 18 (SUTURE) ×2 IMPLANT
SYR BULB IRRIGATION 50ML (SYRINGE) ×2 IMPLANT
TAPE CLOTH SURG 4X10 WHT LF (GAUZE/BANDAGES/DRESSINGS) ×2 IMPLANT
TOWEL OR 17X26 10 PK STRL BLUE (TOWEL DISPOSABLE) ×2 IMPLANT
YANKAUER SUCT BULB TIP 10FT TU (MISCELLANEOUS) ×2 IMPLANT

## 2011-08-17 NOTE — Brief Op Note (Signed)
08/17/2011  2:02 PM  PATIENT:  Randy [name]  56 y.o. male  PRE-OPERATIVE DIAGNOSIS:  atypical thyroid nodule   POST-OPERATIVE DIAGNOSIS:  atypical thyroid nodule   PROCEDURE:  Total thyroidectomy  SURGEON:  Surgeon(s) and Role:    * Velora Heckler, MD - Primary  ANESTHESIA:   general  EBL:  Total I/O In: 1050 [I.V.:1050] Out: 20 [Blood:20]  BLOOD ADMINISTERED:none  DRAINS: none   LOCAL MEDICATIONS USED:  NONE  SPECIMEN:  Excision  DISPOSITION OF SPECIMEN:  PATHOLOGY  COUNTS:  YES  TOURNIQUET:  * No tourniquets in log *  DICTATION: .Other Dictation: Dictation Number [id number]  PLAN OF CARE: Admit for overnight observation  PATIENT DISPOSITION:  PACU - hemodynamically stable.   Delay start of Pharmacological VTE agent (>24hrs) due to surgical blood loss or risk of bleeding: yes  Velora Heckler, MD, FACS General & Endocrine Surgery [facility] Office: [phone]

## 2011-08-17 NOTE — Interval H&P Note (Signed)
History and Physical Interval Note:  08/17/2011 12:06 PM  Randy [name]  has presented today for surgery, with the diagnosis of atypical thyroid nodule.   The various methods of treatment have been discussed with the patient and family. After consideration of risks, benefits and other options for treatment, the patient has consented to    Procedure(s) (LRB): THYROIDECTOMY (N/A) as a surgical intervention .    The patient's history has been reviewed, patient examined, no change in status, stable for surgery.  I have reviewed the patients' chart and labs.  Questions were answered to the patient's satisfaction.    Velora Heckler, MD, FACS General & Endocrine Surgery [facility] Office: [phone]  Dawsyn Ramsaran Judie Petit

## 2011-08-17 NOTE — Anesthesia Preprocedure Evaluation (Signed)
Anesthesia Evaluation  Patient identified by MRN, date of birth, ID band Patient awake    Reviewed: Allergy & Precautions, H&P , NPO status , Patient's Chart, lab work & pertinent test results  Airway Mallampati: II TM Distance: >3 FB Neck ROM: Full    Dental  (+) Edentulous Upper, Poor Dentition and Dental Advisory Given   Pulmonary neg pulmonary ROS,  breath sounds clear to auscultation  Pulmonary exam normal       Cardiovascular hypertension, Pt. on medications + dysrhythmias Atrial Fibrillation Rhythm:Regular Rate:Normal     Neuro/Psych negative neurological ROS  negative psych ROS   GI/Hepatic negative GI ROS, Neg liver ROS,   Endo/Other  Diabetes mellitus-, Type 2, Oral Hypoglycemic Agents  Renal/GU Renal transplant  negative genitourinary   Musculoskeletal negative musculoskeletal ROS (+)   Abdominal   Peds  Hematology negative hematology ROS (+)   Anesthesia Other Findings   Reproductive/Obstetrics negative OB ROS                           Anesthesia Physical Anesthesia Plan  ASA: III  Anesthesia Plan: General   Post-op Pain Management:    Induction: Intravenous  Airway Management Planned: Oral ETT  Additional Equipment:   Intra-op Plan:   Post-operative Plan: Extubation in OR  Informed Consent: I have reviewed the patients History and Physical, chart, labs and discussed the procedure including the risks, benefits and alternatives for the proposed anesthesia with the patient or authorized representative who has indicated his/her understanding and acceptance.   Dental advisory given  Plan Discussed with: CRNA  Anesthesia Plan Comments:         Anesthesia Quick Evaluation

## 2011-08-17 NOTE — H&P (View-Only) (Signed)
Chief Complaint  Patient presents with  . Thyroid Nodule    follicular nodule with atypia - referral from Dr. Talmage Nap and Dr. Eliott Nine    HISTORY: Patient is a 56 year old black male previously evaluated in my practice for a left-sided thyroid nodule. 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 In March 2012 the patient was recommended for total thyroidectomy. Patient delayed surgery. He was seen by his nephrologist to also recommended thyroidectomy. Patient was eventually referred to an endocrinologist who also recommended thyroidectomy. Patient now returns for followup and to discuss thyroid surgery.  Past Medical History  Diagnosis Date  . Chronic kidney disease     dialysis; kidney transplant     Current Outpatient Prescriptions  Medication Sig Dispense Refill  . amLODipine (NORVASC) 10 MG tablet Take 10 mg by mouth 2 (two) times daily.      Marland Kitchen aspirin 81 MG tablet Take 81 mg by mouth daily.      Marland Kitchen atorvastatin (LIPITOR) 80 MG tablet Take 80 mg by mouth daily.      . cloNIDine (CATAPRES) 0.3 MG tablet Take 0.3 mg by mouth 2 (two) times daily.      . cycloSPORINE modified (NEORAL) 100 MG capsule Take 100 mg by mouth 2 (two) times daily.      . enalapril (VASOTEC) 10 MG tablet Take 10 mg by mouth daily.      . mycophenolate (MYFORTIC) 360 MG TBEC Take 360 mg by mouth 2 (two) times daily.         Not on File   History reviewed. No pertinent family history.   History   Social History  . Marital Status: Single    Spouse Name: N/A    Number of Children: N/A  . Years of Education: N/A   Social History Main Topics  . Smoking status: Never Smoker   . Smokeless tobacco: None  . Alcohol Use: No  . Drug Use: No  . Sexually Active:    Other Topics Concern  . None   Social History Narrative  . None     REVIEW OF SYSTEMS - PERTINENT POSITIVES ONLY: Denies tremor. Denies palpitation. Denies  dysphagia. Denies new masses. Denies discomfort.  EXAM: Filed Vitals:   08/05/11 0933  BP: 102/60  Pulse: 45  Temp: 95.9 F (35.5 C)  Resp: 18    HEENT: normocephalic; pupils equal and reactive; sclerae clear; dentition good; mucous membranes moist NECK:  Palpable left thyroid nodule, approximately 2 cm, nontender, mobile with swallowing; right thyroid lobe nodular but without discrete or dominant mass; symmetric on extension; no palpable anterior or posterior cervical lymphadenopathy; no supraclavicular masses; no tenderness CHEST: clear to auscultation bilaterally without rales, rhonchi, or wheezes CARDIAC: regular rate and rhythm without significant murmur; peripheral pulses are full ABDOMEN: soft without distension; bowel sounds present; no mass; no hepatosplenomegaly; no hernia EXT:  non-tender without edema; no deformity; Vascular access left forearm NEURO: no gross focal deficits; no sign of tremor   LABORATORY RESULTS: See [facility] (CHL-Epic) for most recent results   RADIOLOGY RESULTS: See [facility] (CHL-Epic) for most recent results   IMPRESSION: Dominant left thyroid nodule, 2.6 cm, with 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 We reviewed the procedure of total thyroidectomy. We discussed specific risks and benefits including the risk of recurrent laryngeal nerve injury and injury to parathyroid glands. We discussed  the postoperative recovery to be anticipated. We discussed the need for lifelong thyroid hormone replacement. We discussed the potential need for radioactive iodine treatment. Patient understands and wishes to proceed.  The risks and benefits of the procedure have been discussed at length with the patient.  The patient understands the proposed procedure, potential alternative treatments, and the course of recovery to be expected.  All of  the patient's questions have been answered at this time.  The patient wishes to proceed with surgery.  Velora Heckler, MD, FACS General & Endocrine Surgery [facility]   Visit Diagnoses: 1. Neoplasm of uncertain behavior of thyroid gland, left lobe     Primary Care Physician: Sadie Haber, MD  Endocrine:  Dr. Dorisann Frames

## 2011-08-17 NOTE — Anesthesia Postprocedure Evaluation (Signed)
Anesthesia Post Note  Patient: Randy [name]  Procedure(s) Performed: Procedure(s) (LRB): THYROIDECTOMY (N/A)  Anesthesia type: General  Patient location: PACU  Post pain: Pain level controlled  Post assessment: Post-op Vital signs reviewed  Last Vitals:  Filed Vitals:   08/17/11 1445  BP:   Pulse:   Temp: 36.2 C  Resp:     Post vital signs: Reviewed  Level of consciousness: sedated  Complications: No apparent anesthesia complications

## 2011-08-17 NOTE — Transfer of Care (Signed)
Immediate Anesthesia Transfer of Care Note  Patient: Randy [name]  Procedure(s) Performed: Procedure(s) (LRB): THYROIDECTOMY (N/A)  Patient Location: PACU  Anesthesia Type: General  Level of Consciousness: awake, alert , oriented, patient cooperative and responds to stimulation  Airway & Oxygen Therapy: Patient Spontanous Breathing and Patient connected to face mask oxygen  Post-op Assessment: Report given to PACU RN, Post -op Vital signs reviewed and stable and Patient moving all extremities X 4  Post vital signs: stable  Complications: No apparent anesthesia complications

## 2011-08-18 ENCOUNTER — Telehealth (INDEPENDENT_AMBULATORY_CARE_PROVIDER_SITE_OTHER): Payer: Self-pay

## 2011-08-18 ENCOUNTER — Other Ambulatory Visit (INDEPENDENT_AMBULATORY_CARE_PROVIDER_SITE_OTHER): Payer: Self-pay | Admitting: Surgery

## 2011-08-18 ENCOUNTER — Encounter (HOSPITAL_COMMUNITY): Payer: Self-pay | Admitting: Surgery

## 2011-08-18 DIAGNOSIS — E890 Postprocedural hypothyroidism: Secondary | ICD-10-CM

## 2011-08-18 LAB — BASIC METABOLIC PANEL
CO2: 21 mEq/L (ref 19–32)
Chloride: 108 mEq/L (ref 96–112)
GFR calc Af Amer: 69 mL/min — ABNORMAL LOW (ref >90)
Potassium: 4.3 mEq/L (ref 3.5–5.1)

## 2011-08-18 MED ORDER — HYDROCODONE-ACETAMINOPHEN 5-325 MG PO TABS: 1.0000 | ORAL_TABLET | ORAL | Status: AC | PRN

## 2011-08-18 MED ORDER — SYNTHROID 100 MCG PO TABS: 100.0000 ug | ORAL_TABLET | Freq: Every day | ORAL | Status: DC

## 2011-08-18 NOTE — Op Note (Signed)
NAMEBENJAMYN, HESTAND NO.:  [id number]  MEDICAL RECORD NO.:  [id number]  LOCATION:  1615                         FACILITY:  [facility]  PHYSICIAN:  Velora Heckler, MD      DATE OF BIRTH:  07-Nov-1955  DATE OF PROCEDURE:  08/17/2011                               OPERATIVE REPORT   PREOPERATIVE DIAGNOSIS:  Left thyroid nodule with cytologic atypia.  POSTOPERATIVE DIAGNOSIS:  Left thyroid nodule with cytologic atypia.  PROCEDURE:  Total thyroidectomy.  SURGEON:  Velora Heckler, MD, FACS  ANESTHESIA:  General per Jenelle Mages. Fortune, M.D.  ESTIMATED BLOOD LOSS:  Minimal.  PREPARATION:  ChloraPrep.  COMPLICATIONS:  None.  INDICATIONS:  The patient is a 56 year old, black male, diagnosed in early 2012, with a left-sided thyroid nodule.  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  The patient was recommended for surgery in March 2012.  The patient decided to delay surgery.  He was subsequently evaluated by his nephrologist and by his endocrinologist, both of whom recommended thyroidectomy.  The patient returned in June 2013, for surgical resection.  BODY OF REPORT:  The procedure was done in OR #11 at the [facility]  The patient was brought to the operating room, placed in supine position on the operating room table.  Following administration of general anesthesia, the patient was positioned and then prepped and draped in the usual strict aseptic fashion.  After ascertaining that an adequate level of anesthesia have been achieved, a Kocher incision was made with a #15 blade.  Dissection was carried through subcutaneous tissues and platysma.  Hemostasis was obtained with the electrocautery.  Skin flaps were elevated cephalad and caudad from the thyroid notch to the sternal notch.  A Mahorner self-retaining retractor was placed for exposure.  Strap muscles were  divided in the midline.  Strap muscles were reflected to the left to expose the left thyroid lobe.  Venous tributaries were divided between Ligaclips with the Harmonic scalpel.  The left lobe was dissected out.  Palpation shows a dominant nodule in the central and interior portion of the left lobe. This represents a dominant mass which previously underwent fine-needle aspiration biopsy.  The left lobe was gently mobilized.  Superior pole vessels were divided between medium Ligaclips with the Harmonic Scalpel. Inferior venous tributaries were divided between small and medium Ligaclips with the Harmonic Scalpel.  Gland was rolled anteriorly. Branches of the inferior thyroid artery were divided between small Ligaclips with the Harmonic Scalpel.  Ligament of Allyson Sabal was released with the electrocautery.  Care was taken to preserve the recurrent nerve and the parathyroid tissue.  Gland was rotated up and onto the anterior trachea.  The isthmus was mobilized across the midline.  There was no significant pyramidal lobe present.  Dry pack was placed in the left neck.  Next, we turned our attention to the right side.  Strap muscles were again reflected laterally.  Right thyroid lobe was exposed.  Right lobe was somewhat smaller.  There are no dominant masses.  Venous tributaries were divided between Ligaclips with the Harmonic Scalpel.  Superior pole vessels  were dissected out and divided between medium Ligaclips with the Harmonic Scalpel.  Gland was rotated anteriorly and branches of the inferior thyroid artery were divided between small Ligaclips with the Harmonic Scalpel.  Inferior venous tributaries were divided between Ligaclips.  Gland was rotated further anteriorly and the ligament of Allyson Sabal was released with the electrocautery.  The remaining tissue was excised off the anterior trachea.  Gland was completely excised.  Suture was used to mark the left superior pole.  The entire thyroid gland  was submitted to pathology for review.  The neck was irrigated with warm saline and hemostasis was achieved with the electrocautery.  Surgicel was placed throughout the operative field. Strap muscles were reapproximated in the midline with interrupted 3-0 Vicryl sutures.  Platysma was closed with interrupted 3-0 Vicryl sutures.  Skin was closed with a running 4-0 Monocryl subcuticular suture.  Wound was washed and dried and benzoin and Steri-Strips were applied.  Sterile dressings were applied.  The patient was awakened from anesthesia and brought to the recovery room.  The patient tolerated the procedure well.  Velora Heckler, MD, FACS General & Endocrine Surgery [facility] Office: [phone]   TMG/MEDQ  D:  08/17/2011  T:  08/17/2011  Job:  [id number]  cc:   Duke Salvia. Eliott Nine, M.D. Fax: [phone]

## 2011-08-18 NOTE — Progress Notes (Signed)
Pt to d/c home. AVS reviewed. Pt capable of verbalizing medications and follow-up appointments. Remains hemodynamically stable. No signs and symptoms of distress. Educated pt to return to ER in the case of SOB, dizziness, or chest pain.  Pt given script for Norco. Pt has no further questions. Clean gauze applied to sight. No swelling or drainage at site. Leaving with family member to return home.

## 2011-08-18 NOTE — Discharge Summary (Signed)
Physician Discharge Summary [facility]  Patient ID: Randy [name] MRN: [id number] DOB/AGE: [birth year] 56 y.o.  Admit date: 08/17/2011 Discharge date: 08/18/2011  Admission Diagnoses:  Thyroid nodule with atypia  Discharge Diagnoses:  Active Problems:  * No active hospital problems. *    Discharged Condition: good  Hospital Course: patient admitted for observation after total thyroidectomy.  Pain controlled with oral narcotics.  Calcium level stable this AM at 8.8.  Prepared for discharge.  Consults: None  Significant Diagnostic Studies: labs: calcium  Treatments: surgery: total thyroidectomy  Discharge Exam: Blood pressure 120/79, pulse 67, temperature 97.8 F (36.6 C), temperature source Oral, resp. rate 16, height 6\' 2"  (1.88 m), weight 188 lb (85.276 kg), SpO2 97.00%. HEENT - clear Neck - wound clear and dry Chest - clear  Disposition: Home with family  Discharge Orders    Future Appointments: Provider: Department: Dept Phone: Center:   08/30/2011 2:00 PM Velora Heckler, MD [facility] [phone] None     Future Orders Please Complete By Expires   Diet - low sodium heart healthy      Increase activity slowly      Discharge instructions      Comments:   [facility], [place] [phone]  THYROID & PARATHYROID SURGERY -- POST OP INSTRUCTIONS  Always review your discharge instruction sheet from the facility where your surgery was performed.  A prescription for pain medication may be given to you upon discharge.  Take your pain medication as prescribed, if needed.  If narcotic pain medicine is not needed, then you may take acetaminophen (Tylenol) or ibuprofen (Advil) as needed. Take your usually prescribed medications unless otherwise directed. If you need a refill on your pain medication, please contact your pharmacy. They will contact our office to request authorization.  Prescriptions will not be processed after 5 pm or on  weekends. Start with a light diet upon arrival home, such as soup and crackers, etc.  Be sure to drink pleny of fluids daily.  Resume your normal diet the day after surgery. Most patients will experience some swelling and bruising on the chest and neck area.  Ice packs will help.  Swelling and bruising can take several days to resolve.  It is common to experience some constipation if taking pain medication after surgery.  Increasing fluid intake and taking a stool softener will usually help or prevent this problem.  A mild laxative (Milk of Magnesia or Miralax) should be taken according to package directions if there are no bowel movements after 48 hours. You may remove your bandages 24-48 hours after surgery, and you may shower at that time.  You have steri-strips (small skin tapes) in place directly over the incision.  These strips should be left on the skin for 7-10 days and then removed. You may resume regular (light) daily activities beginning the next day--such as daily self-care, walking, climbing stairs--gradually increasing activities as tolerated.  You may have sexual intercourse when it is comfortable.  Refrain from any heavy lifting or straining until approved by your doctor.  You may drive when you no longer are taking prescription pain medication, you can comfortably wear a seatbelt, and you can safely maneuver your car and apply brakes. You should see your doctor in the office for a follow-up appointment approximately two weeks after your surgery.  Make sure that you call for this appointment within a day or two after you arrive home to insure a convenient appointment time.  WHEN TO  CALL YOUR DOCTOR: Fever over 101.5 Inability to urinate Nausea and/or vomiting - persistent Extreme swelling or bruising Continued bleeding from incision Increased pain, redness, or drainage from the incision Difficulty swallowing or breathing Muscle cramping or spasms Numbness or tingling in hands or feet  or around lips  The clinic staff is available to answer your questions during regular business hours.  Please don't hesitate to call and ask to speak to one of the nurses if you have concerns.  [email or web address]   Remove dressing in 24 hours      Apply dressing      Comments:   Apply dry gauze dressing to neck wound before discharge.  Velora Heckler, MD, [facility] Office: [phone]     Medication List  As of 08/18/2011  7:58 AM   TAKE these medications         amLODipine 10 MG tablet   Commonly known as: NORVASC   Take 10 mg by mouth 2 (two) times daily.      aspirin 81 MG tablet   Take 81 mg by mouth daily.      atorvastatin 80 MG tablet   Commonly known as: LIPITOR   Take 80 mg by mouth daily.      cloNIDine 0.3 MG tablet   Commonly known as: CATAPRES   Take 0.3 mg by mouth 2 (two) times daily.      cycloSPORINE modified 100 MG capsule   Commonly known as: NEORAL   Take 100 mg by mouth 2 (two) times daily.      enalapril 5 MG tablet   Commonly known as: VASOTEC   Take 5 mg by mouth at bedtime.      glipiZIDE 5 MG 24 hr tablet   Commonly known as: GLUCOTROL XL   Take 5 mg by mouth daily.      HYDROcodone-acetaminophen 5-325 MG per tablet   Commonly known as: NORCO   Take 1-2 tablets by mouth every 4 (four) hours as needed for pain.      mycophenolate 360 MG Tbec   Commonly known as: MYFORTIC   Take 720 mg by mouth 2 (two) times daily.      predniSONE 5 MG tablet   Commonly known as: DELTASONE   Take 5 mg by mouth daily.      SYNTHROID 100 MCG tablet   Generic drug: levothyroxine   Take 1 tablet (100 mcg total) by mouth daily.           Velora Heckler, MD, FACS General & Endocrine Surgery [facility] Office: [phone]   Signed: Velora Heckler 08/18/2011, 7:58 AM

## 2011-08-18 NOTE — Telephone Encounter (Signed)
Notified pt he has follow up appt after thyroidectomy with Dr. Dorisann Frames on 09/24/11 at 11:45am.  He is aware of 08/30/11 appt with Dr. Gerrit Friends.

## 2011-08-25 ENCOUNTER — Other Ambulatory Visit (INDEPENDENT_AMBULATORY_CARE_PROVIDER_SITE_OTHER): Payer: Self-pay | Admitting: Surgery

## 2011-08-25 DIAGNOSIS — Z9889 Other specified postprocedural states: Secondary | ICD-10-CM | POA: Diagnosis not present

## 2011-08-27 ENCOUNTER — Telehealth (INDEPENDENT_AMBULATORY_CARE_PROVIDER_SITE_OTHER): Payer: Self-pay | Admitting: Surgery

## 2011-08-27 DIAGNOSIS — D440 Neoplasm of uncertain behavior of thyroid gland: Secondary | ICD-10-CM

## 2011-08-27 DIAGNOSIS — C73 Malignant neoplasm of thyroid gland: Secondary | ICD-10-CM

## 2011-08-27 NOTE — Telephone Encounter (Signed)
Called results of final pathology to patient.  Follicular variant of papillary thyroid carcinoma, oncocytic type.  He will require adjuvant treatment with radioactive iodine.  Will coordinate with Dr. Willeen Cass office for follow up.  Velora Heckler, MD, [facility] Office: [phone]

## 2011-08-30 ENCOUNTER — Ambulatory Visit (INDEPENDENT_AMBULATORY_CARE_PROVIDER_SITE_OTHER): Payer: Medicare Other | Admitting: Surgery

## 2011-08-30 ENCOUNTER — Encounter (INDEPENDENT_AMBULATORY_CARE_PROVIDER_SITE_OTHER): Payer: Self-pay | Admitting: Surgery

## 2011-08-30 VITALS — BP 138/88 | HR 64 | Temp 99.5°F | Resp 16 | Ht 74.0 in | Wt 188.8 lb

## 2011-08-30 DIAGNOSIS — C73 Malignant neoplasm of thyroid gland: Secondary | ICD-10-CM

## 2011-08-30 NOTE — Progress Notes (Signed)
General Surgery [facility]  Visit Diagnoses: 1. Thyroid cancer     HISTORY: Patient returns for her first postoperative visit having undergone total thyroidectomy. Final pathology shows a 2.5 cm papillary thyroid cancer in the left thyroid lobe. Margins are negative. This is a follicular variant of oncocytic type. Patient will require radioactive iodine treatment. Followup appointment has been scheduled with his endocrinologist on September 24, 2011. He is currently taking Synthroid 100 mcg daily.  EXAM: Surgical wound is healing nicely. Moderate soft tissue swelling. Voice quality is normal. No sign of infection.  IMPRESSION: Follicular variant of papillary thyroid carcinoma, oncocytic type, 2.5 cm  PLAN: Patient will be seen by his endocrinologist in 3 weeks. He will require radioactive iodine treatment. Patient will return to see me in 6 weeks. He will begin applying topical creams to his incision.  Velora Heckler, MD, FACS General & Endocrine Surgery [facility]

## 2011-08-30 NOTE — Patient Instructions (Signed)
  COCOA BUTTER & VITAMIN E CREAM  (Palmer's or other brand)  Apply cocoa butter/vitamin E cream to your incision 2 - 3 times daily.  Massage cream into incision for one minute with each application.  Use sunscreen (50 SPF or higher) for first 6 months after surgery if area is exposed to sun.  You may substitute Mederma or other scar reducing creams as desired.   

## 2011-09-14 DIAGNOSIS — C73 Malignant neoplasm of thyroid gland: Secondary | ICD-10-CM | POA: Diagnosis not present

## 2011-09-16 ENCOUNTER — Encounter (INDEPENDENT_AMBULATORY_CARE_PROVIDER_SITE_OTHER): Payer: Self-pay

## 2011-09-21 DIAGNOSIS — C73 Malignant neoplasm of thyroid gland: Secondary | ICD-10-CM | POA: Diagnosis not present

## 2011-09-21 DIAGNOSIS — E890 Postprocedural hypothyroidism: Secondary | ICD-10-CM | POA: Diagnosis not present

## 2011-09-22 ENCOUNTER — Other Ambulatory Visit (HOSPITAL_COMMUNITY): Payer: Self-pay | Admitting: Endocrinology

## 2011-09-22 DIAGNOSIS — C73 Malignant neoplasm of thyroid gland: Secondary | ICD-10-CM

## 2011-10-04 ENCOUNTER — Encounter (HOSPITAL_COMMUNITY)
Admission: RE | Admit: 2011-10-04 | Discharge: 2011-10-04 | Disposition: A | Discharge status: Home / Self Care | Payer: Medicare Other | Source: Ambulatory Visit | Attending: Endocrinology | Admitting: Endocrinology

## 2011-10-04 DIAGNOSIS — C73 Malignant neoplasm of thyroid gland: Secondary | ICD-10-CM | POA: Diagnosis not present

## 2011-10-04 MED ORDER — THYROTROPIN ALFA 1.1 MG IM SOLR
0.9000 mg | INTRAMUSCULAR | Status: DC
  Filled 2011-10-04: qty 0.9

## 2011-10-05 ENCOUNTER — Encounter (HOSPITAL_COMMUNITY)
Admission: RE | Admit: 2011-10-05 | Discharge: 2011-10-05 | Disposition: A | Discharge status: Home / Self Care | Payer: Medicare Other | Source: Ambulatory Visit | Attending: Endocrinology | Admitting: Endocrinology

## 2011-10-05 MED ORDER — THYROTROPIN ALFA 1.1 MG IM SOLR
0.9000 mg | INTRAMUSCULAR | Status: DC
  Filled 2011-10-05: qty 0.9

## 2011-10-06 ENCOUNTER — Encounter (HOSPITAL_COMMUNITY)
Admission: RE | Admit: 2011-10-06 | Discharge: 2011-10-06 | Disposition: A | Discharge status: Home / Self Care | Payer: Medicare Other | Source: Ambulatory Visit | Attending: Endocrinology | Admitting: Endocrinology

## 2011-10-06 MED ORDER — SODIUM IODIDE I 131 CAPSULE
104.0000 | Freq: Once | INTRAVENOUS | Status: AC | PRN
  Administered 2011-10-06: 104 via ORAL

## 2011-10-13 ENCOUNTER — Encounter (HOSPITAL_COMMUNITY)
Admission: RE | Admit: 2011-10-13 | Discharge: 2011-10-13 | Disposition: A | Discharge status: Home / Self Care | Payer: Medicare Other | Source: Ambulatory Visit | Attending: Endocrinology | Admitting: Endocrinology

## 2011-10-13 DIAGNOSIS — C73 Malignant neoplasm of thyroid gland: Secondary | ICD-10-CM | POA: Insufficient documentation

## 2011-10-13 MED FILL — Thyrotropin Alfa For Inj 1.1 MG: INTRAMUSCULAR | Qty: 1.8 | Status: AC

## 2011-10-18 ENCOUNTER — Encounter (INDEPENDENT_AMBULATORY_CARE_PROVIDER_SITE_OTHER): Payer: Medicare Other | Admitting: Surgery

## 2011-11-15 ENCOUNTER — Ambulatory Visit (INDEPENDENT_AMBULATORY_CARE_PROVIDER_SITE_OTHER): Payer: Medicare Other | Admitting: Surgery

## 2011-11-15 ENCOUNTER — Encounter (INDEPENDENT_AMBULATORY_CARE_PROVIDER_SITE_OTHER): Payer: Self-pay | Admitting: Surgery

## 2011-11-15 VITALS — BP 110/84 | HR 77 | Temp 97.4°F | Ht 74.0 in | Wt 193.8 lb

## 2011-11-15 DIAGNOSIS — C73 Malignant neoplasm of thyroid gland: Secondary | ICD-10-CM

## 2011-11-15 NOTE — Progress Notes (Signed)
General Surgery [facility]  Visit Diagnoses: 1. Thyroid cancer     HISTORY: Patient returns for his second postoperative visit having undergone total thyroidectomy for follicular variant of papillary thyroid carcinoma. Patient did receive radioactive iodine treatment under the direction of his endocrinologist in August 2013. He continues to apply topical creams to his incisions. He is currently taking Synthroid 100 mcg daily.  EXAM: Surgical wound is well healed. No sign of seroma. No sign of infection. No palpable masses in the thyroid bed. No anterior or posterior cervical lymphadenopathy. No supraclavicular masses. Voice quality is normal.  IMPRESSION: Status post total thyroidectomy for follicular variant of papillary thyroid carcinoma  PLAN: Patient will continue to take Synthroid 100 mcg daily. He will see his endocrinologist in followup and have laboratory studies checked at that time. Patient will return to see me for followup in 6 months.  Velora Heckler, MD, FACS General & Endocrine Surgery [facility]

## 2011-11-15 NOTE — Patient Instructions (Signed)
  COCOA BUTTER & VITAMIN E CREAM  (Palmer's or other brand)  Apply cocoa butter/vitamin E cream to your incision 2 - 3 times daily.  Massage cream into incision for one minute with each application.  Use sunscreen (50 SPF or higher) for first 6 months after surgery if area is exposed to sun.  You may substitute Mederma or other scar reducing creams as desired.   

## 2011-12-16 DIAGNOSIS — E785 Hyperlipidemia, unspecified: Secondary | ICD-10-CM | POA: Diagnosis not present

## 2011-12-16 DIAGNOSIS — E039 Hypothyroidism, unspecified: Secondary | ICD-10-CM | POA: Diagnosis not present

## 2011-12-16 DIAGNOSIS — I10 Essential (primary) hypertension: Secondary | ICD-10-CM | POA: Diagnosis not present

## 2011-12-16 DIAGNOSIS — Z940 Kidney transplant status: Secondary | ICD-10-CM | POA: Diagnosis not present

## 2011-12-16 DIAGNOSIS — E559 Vitamin D deficiency, unspecified: Secondary | ICD-10-CM | POA: Diagnosis not present

## 2011-12-16 DIAGNOSIS — E119 Type 2 diabetes mellitus without complications: Secondary | ICD-10-CM | POA: Diagnosis not present

## 2011-12-29 DIAGNOSIS — E890 Postprocedural hypothyroidism: Secondary | ICD-10-CM | POA: Diagnosis not present

## 2011-12-29 DIAGNOSIS — C73 Malignant neoplasm of thyroid gland: Secondary | ICD-10-CM | POA: Diagnosis not present

## 2012-02-09 DIAGNOSIS — C73 Malignant neoplasm of thyroid gland: Secondary | ICD-10-CM | POA: Diagnosis not present

## 2012-02-14 ENCOUNTER — Encounter (INDEPENDENT_AMBULATORY_CARE_PROVIDER_SITE_OTHER): Payer: Self-pay

## 2012-03-23 DIAGNOSIS — Z940 Kidney transplant status: Secondary | ICD-10-CM | POA: Diagnosis not present

## 2012-03-23 DIAGNOSIS — E039 Hypothyroidism, unspecified: Secondary | ICD-10-CM | POA: Diagnosis not present

## 2012-03-23 DIAGNOSIS — I10 Essential (primary) hypertension: Secondary | ICD-10-CM | POA: Diagnosis not present

## 2012-05-04 DIAGNOSIS — C73 Malignant neoplasm of thyroid gland: Secondary | ICD-10-CM | POA: Diagnosis not present

## 2012-08-03 ENCOUNTER — Ambulatory Visit (INDEPENDENT_AMBULATORY_CARE_PROVIDER_SITE_OTHER): Payer: Medicare Other | Admitting: *Deleted

## 2012-08-03 VITALS — BP 118/80 | HR 88

## 2012-08-03 DIAGNOSIS — Z940 Kidney transplant status: Secondary | ICD-10-CM | POA: Diagnosis not present

## 2012-08-03 DIAGNOSIS — I10 Essential (primary) hypertension: Secondary | ICD-10-CM | POA: Diagnosis not present

## 2012-08-03 DIAGNOSIS — I4891 Unspecified atrial fibrillation: Secondary | ICD-10-CM | POA: Diagnosis not present

## 2012-08-03 DIAGNOSIS — E559 Vitamin D deficiency, unspecified: Secondary | ICD-10-CM | POA: Diagnosis not present

## 2012-08-03 DIAGNOSIS — E119 Type 2 diabetes mellitus without complications: Secondary | ICD-10-CM | POA: Diagnosis not present

## 2012-08-03 DIAGNOSIS — I499 Cardiac arrhythmia, unspecified: Secondary | ICD-10-CM | POA: Diagnosis not present

## 2012-08-03 MED ORDER — WARFARIN SODIUM 5 MG PO TABS: 5.0000 mg | ORAL_TABLET | Freq: Every day | ORAL | Status: DC

## 2012-08-03 NOTE — Progress Notes (Signed)
Pt presents today from PCP office needing an EKG.  PCP was unable to perform EKG as their machine is broken.  EKG reveals A-fib with a rate of 88.  Pt is asymptomatic.  Dr. Johney Frame reviewed EKG with pt. Pt will start coumadin 5mg  daily and see the coumadin clinic on 08/08/2012 at 10:30am adn see Tereso Newcomer, PA on 08/16/2012 at 11:50am.  Pt agreed to plan.

## 2012-08-08 ENCOUNTER — Ambulatory Visit (INDEPENDENT_AMBULATORY_CARE_PROVIDER_SITE_OTHER): Payer: Medicare Other | Admitting: *Deleted

## 2012-08-08 DIAGNOSIS — Z7901 Long term (current) use of anticoagulants: Secondary | ICD-10-CM

## 2012-08-08 DIAGNOSIS — I4891 Unspecified atrial fibrillation: Secondary | ICD-10-CM

## 2012-08-08 NOTE — Patient Instructions (Addendum)

## 2012-08-16 ENCOUNTER — Ambulatory Visit (INDEPENDENT_AMBULATORY_CARE_PROVIDER_SITE_OTHER): Payer: Medicare Other | Admitting: *Deleted

## 2012-08-16 ENCOUNTER — Encounter: Payer: Self-pay | Admitting: Physician Assistant

## 2012-08-16 ENCOUNTER — Ambulatory Visit (INDEPENDENT_AMBULATORY_CARE_PROVIDER_SITE_OTHER): Payer: Medicare Other | Admitting: Physician Assistant

## 2012-08-16 VITALS — BP 116/82 | HR 55 | Ht 74.0 in | Wt 197.0 lb

## 2012-08-16 DIAGNOSIS — E890 Postprocedural hypothyroidism: Secondary | ICD-10-CM

## 2012-08-16 DIAGNOSIS — I4891 Unspecified atrial fibrillation: Secondary | ICD-10-CM | POA: Diagnosis not present

## 2012-08-16 DIAGNOSIS — E119 Type 2 diabetes mellitus without complications: Secondary | ICD-10-CM

## 2012-08-16 DIAGNOSIS — I10 Essential (primary) hypertension: Secondary | ICD-10-CM

## 2012-08-16 DIAGNOSIS — Z940 Kidney transplant status: Secondary | ICD-10-CM

## 2012-08-16 DIAGNOSIS — Z7901 Long term (current) use of anticoagulants: Secondary | ICD-10-CM

## 2012-08-16 LAB — POCT INR: INR: 2.6

## 2012-08-16 NOTE — Progress Notes (Signed)
[address] [place], [place]  [address] Phone: [phone] Fax:  [phone]  Date:  08/16/2012   ID:  Randy [name], DOB 1956-02-20, MRN [id number]  PCP:  Sadie Haber, MD  Cardiologist:  Dr. Zackery Barefoot     History of Present Illness: Randy [name] is a 57 y.o. male who returns for f/u on AFib.    He has a hx of ESRD, status post renal transplant in 2004, thyroid nodules, HTN. He was evaluated by Dr. Myrtis Ser in 07/2011 prior to thyroid surgery.  He had no unstable cardiac conditions and had had a normal cardiac workup in the past.  He had a follicular variant of papillary thyroid carcinoma and underwent total thyroidectomy. He is now on thyroid replacement therapy.  According to the chart, he was sent over by his primary care physician for an ECG on 08/03/12.  This demonstrated atrial fibrillation with a heart rate of 88. This was reviewed with Dr. Johney Frame who placed the patient on Coumadin and arranged follow up today.  Patient tells me he went in for routine f/u.  He had no palpitations, dizziness, fatigue, chest pain, dyspnea.  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  He has had no problems or decreased exercise tolerance.  He denies orthopnea, PND, edema.  He is tolerating the coumadin well.  CHADS2=2.  Labs (6/13):  K 4.3, Cr 1.3, ALT 24, Hgb 14.1  Wt Readings from Last 3 Encounters:  08/16/12 197 lb (89.359 kg)  11/15/11 193 lb 12.8 oz (87.907 kg)  10/04/11 188 lb (85.276 kg)     Past Medical History  Diagnosis Date  . Chronic kidney disease     dialysis; kidney transplant  2004  . Hypertension   . Multiple thyroid nodules   . Preop cardiovascular exam     Preop clearance for thyroid surgery June, 2013  . Atrial fibrillation     dx 07/2012; CHADS2=2; coumadin started    Current Outpatient Prescriptions  Medication Sig Dispense Refill  . amLODipine (NORVASC) 10 MG tablet Take 10 mg by mouth 2 (two) times daily.      Marland Kitchen aspirin 81  MG tablet Take 81 mg by mouth daily.      Marland Kitchen atorvastatin (LIPITOR) 80 MG tablet Take 80 mg by mouth daily.      . cloNIDine (CATAPRES) 0.3 MG tablet Take 0.3 mg by mouth 2 (two) times daily.      . cycloSPORINE modified (NEORAL) 100 MG capsule Take 100 mg by mouth 2 (two) times daily.      . enalapril (VASOTEC) 5 MG tablet Take 5 mg by mouth at bedtime.       Marland Kitchen glipiZIDE (GLUCOTROL XL) 5 MG 24 hr tablet Take 5 mg by mouth daily.      . mycophenolate (MYFORTIC) 360 MG TBEC Take 720 mg by mouth 2 (two) times daily.       . predniSONE (DELTASONE) 5 MG tablet Take 5 mg by mouth daily.      Marland Kitchen SYNTHROID 100 MCG tablet Take 1 tablet (100 mcg total) by mouth daily.  30 tablet  3  . warfarin (COUMADIN) 5 MG tablet Take 1 tablet (5 mg total) by mouth daily.  90 tablet  3   No current facility-administered medications for this visit.    Allergies:   No Known Allergies  Social History:  The patient  reports that he has never smoked. He does not have any  smokeless tobacco history on file. He reports that he does not drink alcohol or use illicit drugs.   ROS:  Please see the history of present illness.      All other systems reviewed and negative.   PHYSICAL EXAM: VS:  BP 116/82  Pulse 55  Ht 6\' 2"  (1.88 m)  Wt 197 lb (89.359 kg)  BMI 25.28 kg/m2 Well nourished, well developed, in no acute distress HEENT: normal Neck: no JVD Cardiac:  normal S1, S2; RRR; no murmur Lungs:  clear to auscultation bilaterally, no wheezing, rhonchi or rales Abd: soft, nontender, no hepatomegaly Ext: no edema Skin: warm and dry Neuro:  CNs 2-12 intact, no focal abnormalities noted  EKG:  NSR, HR 60, normal axis, lat TWI     ASSESSMENT AND PLAN:  1. Paroxysmal Atrial Fibrillation:  He is now back in NSR.  He had controlled VR.  He was asymptomatic.  I reviewed his chart.  He had AFib on an ECG in 2013 prior to his thyroid surgery.  CHADS2=2.  He would benefit from long term anticoagulation.  With a well  functioning renal transplant, I would be somewhat hesitant to recommend one of the NOACs.  Continue coumadin.  Obtain Echocardiogram.  He will have his TSH checked with his endocrinologist next month.  At this point, he does not need any further medication adjustments.  An event monitor would not be helpful.  If he has symptoms of palpitations, etc, consider event monitor.   2. Hypertension:  Controlled. 3. Diabetes:  Continue f/u with PCP and endocrinology. 4. ESRD, s/p Renal Transplant:  Continue f/u with nephrology. 5. Post Surgical Hypothyroidism:  He has lab work pending in a few weeks with his endocrinologist.  His TSH will be checked then. 6. Disposition:  F/u with Dr. Zackery Barefoot in 6-8 weeks.   Signed, Tereso Newcomer, PA-C  08/16/2012 12:29 PM

## 2012-08-16 NOTE — Addendum Note (Signed)
Addended by: Regis Bill B on: 08/16/2012 06:13 PM   Modules accepted: Orders

## 2012-08-16 NOTE — Patient Instructions (Addendum)
Your physician has requested that you have an echocardiogram. Echocardiography is a painless test that uses sound waves to create images of your heart. It provides your doctor with information about the size and shape of your heart and how well your heart's chambers and valves are working. This procedure takes approximately one hour. There are no restrictions for this procedure.  KEEP YOUR APPOINTMENT WITH THE COUMADIN CLINIC IN July  Your physician recommends that you schedule a follow-up appointment in: 6-8 WEEKS WITH DR Myrtis Ser

## 2012-08-23 ENCOUNTER — Ambulatory Visit (HOSPITAL_COMMUNITY): Payer: Medicare Other | Attending: Cardiology | Admitting: Radiology

## 2012-08-23 ENCOUNTER — Ambulatory Visit (INDEPENDENT_AMBULATORY_CARE_PROVIDER_SITE_OTHER): Payer: Medicare Other | Admitting: *Deleted

## 2012-08-23 DIAGNOSIS — I4891 Unspecified atrial fibrillation: Secondary | ICD-10-CM

## 2012-08-23 DIAGNOSIS — I10 Essential (primary) hypertension: Secondary | ICD-10-CM | POA: Insufficient documentation

## 2012-08-23 DIAGNOSIS — E119 Type 2 diabetes mellitus without complications: Secondary | ICD-10-CM | POA: Diagnosis not present

## 2012-08-23 DIAGNOSIS — Z7901 Long term (current) use of anticoagulants: Secondary | ICD-10-CM | POA: Diagnosis not present

## 2012-08-23 LAB — POCT INR: INR: 2.2

## 2012-08-23 NOTE — Progress Notes (Signed)
Echocardiogram performed.  

## 2012-08-24 ENCOUNTER — Telehealth: Payer: Self-pay | Admitting: *Deleted

## 2012-08-24 ENCOUNTER — Encounter: Payer: Self-pay | Admitting: Physician Assistant

## 2012-08-24 NOTE — Telephone Encounter (Signed)
Message copied by Tarri Fuller on Thu Aug 24, 2012 10:12 AM ------      Message from: [place], [place] T      Created: Thu Aug 24, 2012  9:28 AM       Echo ok with normal LVF      Please notify patient      Tereso Newcomer, PA-C        08/24/2012 9:28 AM ------

## 2012-08-24 NOTE — Telephone Encounter (Signed)
lmom echo good, normal LVF

## 2012-08-30 ENCOUNTER — Ambulatory Visit (INDEPENDENT_AMBULATORY_CARE_PROVIDER_SITE_OTHER): Payer: Medicare Other | Admitting: *Deleted

## 2012-08-30 DIAGNOSIS — Z7901 Long term (current) use of anticoagulants: Secondary | ICD-10-CM

## 2012-08-30 DIAGNOSIS — I4891 Unspecified atrial fibrillation: Secondary | ICD-10-CM | POA: Diagnosis not present

## 2012-08-30 LAB — POCT INR: INR: 3.1

## 2012-09-13 ENCOUNTER — Ambulatory Visit (INDEPENDENT_AMBULATORY_CARE_PROVIDER_SITE_OTHER): Payer: Medicare Other | Admitting: *Deleted

## 2012-09-13 DIAGNOSIS — I4891 Unspecified atrial fibrillation: Secondary | ICD-10-CM | POA: Diagnosis not present

## 2012-09-13 DIAGNOSIS — Z7901 Long term (current) use of anticoagulants: Secondary | ICD-10-CM | POA: Diagnosis not present

## 2012-09-13 LAB — POCT INR: INR: 2.6

## 2012-10-01 ENCOUNTER — Encounter: Payer: Self-pay | Admitting: Cardiology

## 2012-10-01 DIAGNOSIS — R9430 Abnormal result of cardiovascular function study, unspecified: Secondary | ICD-10-CM | POA: Insufficient documentation

## 2012-10-01 DIAGNOSIS — Z7901 Long term (current) use of anticoagulants: Secondary | ICD-10-CM | POA: Insufficient documentation

## 2012-10-04 ENCOUNTER — Ambulatory Visit (INDEPENDENT_AMBULATORY_CARE_PROVIDER_SITE_OTHER): Payer: Medicare Other | Admitting: *Deleted

## 2012-10-04 DIAGNOSIS — I4891 Unspecified atrial fibrillation: Secondary | ICD-10-CM

## 2012-10-04 DIAGNOSIS — Z7901 Long term (current) use of anticoagulants: Secondary | ICD-10-CM

## 2012-10-04 LAB — POCT INR: INR: 3.1

## 2012-10-05 ENCOUNTER — Ambulatory Visit: Payer: Medicare Other | Admitting: Cardiology

## 2012-10-05 DIAGNOSIS — Z79899 Other long term (current) drug therapy: Secondary | ICD-10-CM | POA: Diagnosis not present

## 2012-10-05 DIAGNOSIS — Z940 Kidney transplant status: Secondary | ICD-10-CM | POA: Diagnosis not present

## 2012-10-05 DIAGNOSIS — I10 Essential (primary) hypertension: Secondary | ICD-10-CM | POA: Diagnosis not present

## 2012-10-05 DIAGNOSIS — E119 Type 2 diabetes mellitus without complications: Secondary | ICD-10-CM | POA: Diagnosis not present

## 2012-10-05 DIAGNOSIS — N2581 Secondary hyperparathyroidism of renal origin: Secondary | ICD-10-CM | POA: Diagnosis not present

## 2012-10-18 DIAGNOSIS — E890 Postprocedural hypothyroidism: Secondary | ICD-10-CM | POA: Diagnosis not present

## 2012-10-18 DIAGNOSIS — C73 Malignant neoplasm of thyroid gland: Secondary | ICD-10-CM | POA: Diagnosis not present

## 2012-10-24 ENCOUNTER — Encounter: Payer: Self-pay | Admitting: Cardiology

## 2012-10-24 DIAGNOSIS — E890 Postprocedural hypothyroidism: Secondary | ICD-10-CM | POA: Diagnosis not present

## 2012-10-24 DIAGNOSIS — C73 Malignant neoplasm of thyroid gland: Secondary | ICD-10-CM | POA: Diagnosis not present

## 2012-10-25 ENCOUNTER — Other Ambulatory Visit (HOSPITAL_COMMUNITY): Payer: Self-pay | Admitting: Endocrinology

## 2012-10-25 DIAGNOSIS — C73 Malignant neoplasm of thyroid gland: Secondary | ICD-10-CM

## 2012-10-26 ENCOUNTER — Encounter: Payer: Self-pay | Admitting: Cardiology

## 2012-10-26 ENCOUNTER — Ambulatory Visit (INDEPENDENT_AMBULATORY_CARE_PROVIDER_SITE_OTHER): Payer: Medicare Other | Admitting: Cardiology

## 2012-10-26 ENCOUNTER — Ambulatory Visit (INDEPENDENT_AMBULATORY_CARE_PROVIDER_SITE_OTHER): Payer: Medicare Other

## 2012-10-26 VITALS — BP 120/88 | HR 73 | Ht 74.0 in | Wt 200.0 lb

## 2012-10-26 DIAGNOSIS — I4891 Unspecified atrial fibrillation: Secondary | ICD-10-CM | POA: Diagnosis not present

## 2012-10-26 DIAGNOSIS — Z7901 Long term (current) use of anticoagulants: Secondary | ICD-10-CM

## 2012-10-26 DIAGNOSIS — N189 Chronic kidney disease, unspecified: Secondary | ICD-10-CM | POA: Diagnosis not present

## 2012-10-26 NOTE — Patient Instructions (Addendum)
Your physician recommends that you continue on your current medications as directed. Please refer to the Current Medication list given to you today.  Your physician wants you to follow-up in: 6 months. You will receive a reminder letter in the mail two months in advance. If you don't receive a letter, please call our office to schedule the follow-up appointment.  

## 2012-10-26 NOTE — Assessment & Plan Note (Signed)
Patient is on Coumadin. In the past he was on 81 mg of aspirin. At this point this is continued. From the cardiac viewpoint he does not need aspirin. I've asked him to speak with Dr. Eliott Nine about this when he sees her next. Certainly aspirin can be continued if it is felt that there is a indication from the renal viewpoint. Otherwise, we will want to reduce his bleeding risk by stopping aspirin and continuing his Coumadin.

## 2012-10-26 NOTE — Assessment & Plan Note (Signed)
Fortunately he continues to do well after his kidney transplant in 2004.

## 2012-10-26 NOTE — Assessment & Plan Note (Signed)
Atrial fibrillation was diagnosed on a routine office visit when the patient was asymptomatic. We do not know if his thyroid dosing played a role in his atrial fib. Decision was made to use Coumadin. He's not had any symptoms. His rhythm is regular today.

## 2012-10-26 NOTE — Progress Notes (Signed)
HPI  The patient is seen to followup atrial fibrillation. 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 He does not have any known coronary artery disease. He had thyroid cancer. He had surgery and then was receiving thyroid replacement. He developed atrial fibrillation  he converted to sinus rhythm. Coumadin was started in June, 2014. He tells me today that his endocrinologist mentioned that the thyroid dosing may have played a roll with his atrial fibrillation. Of course we will never know. He does not feel his atrial fibrillation. He was diagnosed at the time of a routine exam with his nephrologist.   Historically he was on aspirin. This was not stopped when his Coumadin was started. This issue will have to be reviewed. No Known Allergies  Current Outpatient Prescriptions  Medication Sig Dispense Refill  . amLODipine (NORVASC) 10 MG tablet Take 10 mg by mouth 2 (two) times daily.      Marland Kitchen aspirin 81 MG tablet Take 81 mg by mouth daily.      Marland Kitchen atorvastatin (LIPITOR) 80 MG tablet Take 80 mg by mouth daily.      . cloNIDine (CATAPRES) 0.3 MG tablet Take 0.3 mg by mouth 2 (two) times daily.      . cycloSPORINE modified (NEORAL) 100 MG capsule Take 100 mg by mouth 2 (two) times daily.      . enalapril (VASOTEC) 5 MG tablet Take 5 mg by mouth at bedtime.       Marland Kitchen glipiZIDE (GLUCOTROL XL) 5 MG 24 hr tablet Take 5 mg by mouth daily.      Marland Kitchen levothyroxine (SYNTHROID) 100 MCG tablet Take 100 mcg by mouth daily.      . mycophenolate (MYFORTIC) 360 MG TBEC Take 720 mg by mouth 2 (two) times daily.       . predniSONE (DELTASONE) 5 MG tablet Take 5 mg by mouth daily.      Marland Kitchen warfarin (COUMADIN) 5 MG tablet Take 1 tablet (5 mg total) by mouth daily.  90 tablet  3   No current facility-administered medications for this visit.    History   Social History  . Marital Status: Single    Spouse Name: N/A    Number of Children: N/A  . Years of Education: N/A   Occupational History  . Not on file.    Social History Main Topics  . Smoking status: Never Smoker   . Smokeless tobacco: Not on file  . Alcohol Use: No  . Drug Use: No  . Sexual Activity:    Other Topics Concern  . Not on file   Social History Narrative  . No narrative on file    History reviewed. No pertinent family history.  Past Medical History  Diagnosis Date  . Chronic kidney disease     dialysis; kidney transplant  2004  . Hypertension   . Multiple thyroid nodules   . Preop cardiovascular exam     Preop clearance for thyroid surgery June, 2013  . Atrial fibrillation     dx 07/2012; CHADS2=2; coumadin started  . Ejection fraction     a. Echo 7/14:  Mod LVH, EF 55-60%, Gr 2 DD, MAC, mild LAE, PASP 32, trivial Eff.  . Thyroid cancer   . Ejection fraction     .    Past Surgical History  Procedure Laterality Date  . Kidney transplant  2004  . Thyroidectomy  08/17/2011    Procedure: THYROIDECTOMY;  Surgeon: Velora Heckler, MD;  Location: [facility];  Service: General;  Laterality: N/A;    Patient Active Problem List   Diagnosis Date Noted  . Warfarin anticoagulation 10/01/2012  . Ejection fraction   . Atrial fibrillation 08/08/2012  . Long term (current) use of anticoagulants 08/08/2012  . Thyroid cancer 08/27/2011  . Preop cardiovascular exam   . Chronic kidney disease   . Hypertension   . Multiple thyroid nodules     ROS   Patient denies fever, chills, headache, sweats, rash, change in vision, change in hearing, chest pain, cough, nausea vomiting, urinary symptoms. All other systems are reviewed and are negative.  PHYSICAL EXAM  The patient is quite stable. He is oriented to person time and place. Affect is normal. There is no jugulovenous distention. Lungs are clear. Respiratory effort is nonlabored. Cardiac exam reveals S1 and S2. The rhythm is regular. Abdomen is soft. There is no peripheral edema.  Filed Vitals:   10/26/12 1408  BP: 120/88  Pulse: 73  Height: 6\' 2"  (1.88 m)  Weight: 200  lb (90.719 kg)  SpO2: 98%     ASSESSMENT & PLAN

## 2012-10-27 NOTE — Addendum Note (Signed)
Addended by: Valrie Hart on: 10/27/2012 08:40 AM   Modules accepted: Orders

## 2012-11-13 ENCOUNTER — Encounter (HOSPITAL_COMMUNITY)
Admission: RE | Admit: 2012-11-13 | Discharge: 2012-11-13 | Disposition: A | Discharge status: Home / Self Care | Payer: Medicare Other | Source: Ambulatory Visit | Attending: Endocrinology | Admitting: Endocrinology

## 2012-11-13 DIAGNOSIS — C73 Malignant neoplasm of thyroid gland: Secondary | ICD-10-CM | POA: Diagnosis not present

## 2012-11-13 MED ORDER — THYROTROPIN ALFA 1.1 MG IM SOLR
0.9000 mg | INTRAMUSCULAR | Status: AC
  Administered 2012-11-13: 0.9 mg via INTRAMUSCULAR
  Filled 2012-11-13: qty 0.9

## 2012-11-14 ENCOUNTER — Encounter (HOSPITAL_COMMUNITY)
Admission: RE | Admit: 2012-11-14 | Discharge: 2012-11-14 | Disposition: A | Discharge status: Home / Self Care | Payer: Medicare Other | Source: Ambulatory Visit | Attending: Endocrinology | Admitting: Endocrinology

## 2012-11-14 MED ORDER — THYROTROPIN ALFA 1.1 MG IM SOLR
0.9000 mg | INTRAMUSCULAR | Status: AC
  Administered 2012-11-14: 0.9 mg via INTRAMUSCULAR
  Filled 2012-11-14: qty 0.9

## 2012-11-15 ENCOUNTER — Encounter (HOSPITAL_COMMUNITY)
Admission: RE | Admit: 2012-11-15 | Discharge: 2012-11-15 | Disposition: A | Discharge status: Home / Self Care | Payer: Medicare Other | Source: Ambulatory Visit | Attending: Endocrinology | Admitting: Endocrinology

## 2012-11-17 ENCOUNTER — Encounter (HOSPITAL_COMMUNITY)
Admission: RE | Admit: 2012-11-17 | Discharge: 2012-11-17 | Disposition: A | Discharge status: Home / Self Care | Payer: Medicare Other | Source: Ambulatory Visit | Attending: Endocrinology | Admitting: Endocrinology

## 2012-11-17 DIAGNOSIS — C73 Malignant neoplasm of thyroid gland: Secondary | ICD-10-CM | POA: Diagnosis not present

## 2012-11-17 MED ORDER — SODIUM IODIDE I 131 CAPSULE
4.1000 | Freq: Once | INTRAVENOUS | Status: AC | PRN
  Administered 2012-11-17: 4.1 via ORAL

## 2012-11-23 ENCOUNTER — Ambulatory Visit (INDEPENDENT_AMBULATORY_CARE_PROVIDER_SITE_OTHER): Payer: Medicare Other | Admitting: General Practice

## 2012-11-23 DIAGNOSIS — Z7901 Long term (current) use of anticoagulants: Secondary | ICD-10-CM | POA: Diagnosis not present

## 2012-11-23 DIAGNOSIS — I4891 Unspecified atrial fibrillation: Secondary | ICD-10-CM | POA: Diagnosis not present

## 2012-11-23 LAB — POCT INR: INR: 3.5

## 2012-12-15 ENCOUNTER — Ambulatory Visit (INDEPENDENT_AMBULATORY_CARE_PROVIDER_SITE_OTHER): Payer: Medicare Other | Admitting: General Practice

## 2012-12-15 DIAGNOSIS — Z7901 Long term (current) use of anticoagulants: Secondary | ICD-10-CM | POA: Diagnosis not present

## 2012-12-15 DIAGNOSIS — I4891 Unspecified atrial fibrillation: Secondary | ICD-10-CM

## 2013-01-08 DIAGNOSIS — E139 Other specified diabetes mellitus without complications: Secondary | ICD-10-CM | POA: Diagnosis not present

## 2013-01-08 DIAGNOSIS — N2581 Secondary hyperparathyroidism of renal origin: Secondary | ICD-10-CM | POA: Diagnosis not present

## 2013-01-08 DIAGNOSIS — E785 Hyperlipidemia, unspecified: Secondary | ICD-10-CM | POA: Diagnosis not present

## 2013-01-08 DIAGNOSIS — Z940 Kidney transplant status: Secondary | ICD-10-CM | POA: Diagnosis not present

## 2013-01-08 DIAGNOSIS — Z85850 Personal history of malignant neoplasm of thyroid: Secondary | ICD-10-CM | POA: Diagnosis not present

## 2013-01-08 DIAGNOSIS — I10 Essential (primary) hypertension: Secondary | ICD-10-CM | POA: Diagnosis not present

## 2013-01-12 ENCOUNTER — Ambulatory Visit (INDEPENDENT_AMBULATORY_CARE_PROVIDER_SITE_OTHER): Payer: Medicare Other | Admitting: Pharmacist

## 2013-01-12 DIAGNOSIS — Z7901 Long term (current) use of anticoagulants: Secondary | ICD-10-CM | POA: Diagnosis not present

## 2013-01-12 DIAGNOSIS — I4891 Unspecified atrial fibrillation: Secondary | ICD-10-CM | POA: Diagnosis not present

## 2013-01-12 LAB — POCT INR: INR: 2.4

## 2013-02-09 ENCOUNTER — Ambulatory Visit (INDEPENDENT_AMBULATORY_CARE_PROVIDER_SITE_OTHER): Payer: Medicare Other

## 2013-02-09 DIAGNOSIS — I4891 Unspecified atrial fibrillation: Secondary | ICD-10-CM | POA: Diagnosis not present

## 2013-02-09 DIAGNOSIS — Z7901 Long term (current) use of anticoagulants: Secondary | ICD-10-CM | POA: Diagnosis not present

## 2013-02-09 LAB — POCT INR: INR: 2.9

## 2013-03-23 ENCOUNTER — Ambulatory Visit (INDEPENDENT_AMBULATORY_CARE_PROVIDER_SITE_OTHER): Payer: Medicare Other | Admitting: *Deleted

## 2013-03-23 DIAGNOSIS — I4891 Unspecified atrial fibrillation: Secondary | ICD-10-CM

## 2013-03-23 DIAGNOSIS — Z7901 Long term (current) use of anticoagulants: Secondary | ICD-10-CM | POA: Diagnosis not present

## 2013-03-23 DIAGNOSIS — Z5181 Encounter for therapeutic drug level monitoring: Secondary | ICD-10-CM

## 2013-03-23 LAB — POCT INR: INR: 3

## 2013-04-06 DIAGNOSIS — I10 Essential (primary) hypertension: Secondary | ICD-10-CM | POA: Diagnosis not present

## 2013-04-06 DIAGNOSIS — N2581 Secondary hyperparathyroidism of renal origin: Secondary | ICD-10-CM | POA: Diagnosis not present

## 2013-04-06 DIAGNOSIS — Z940 Kidney transplant status: Secondary | ICD-10-CM | POA: Diagnosis not present

## 2013-04-06 DIAGNOSIS — E785 Hyperlipidemia, unspecified: Secondary | ICD-10-CM | POA: Diagnosis not present

## 2013-05-04 ENCOUNTER — Ambulatory Visit (INDEPENDENT_AMBULATORY_CARE_PROVIDER_SITE_OTHER): Payer: Medicare Other | Admitting: Pharmacist

## 2013-05-04 DIAGNOSIS — Z7901 Long term (current) use of anticoagulants: Secondary | ICD-10-CM | POA: Diagnosis not present

## 2013-05-04 DIAGNOSIS — Z5181 Encounter for therapeutic drug level monitoring: Secondary | ICD-10-CM

## 2013-05-04 DIAGNOSIS — I4891 Unspecified atrial fibrillation: Secondary | ICD-10-CM | POA: Diagnosis not present

## 2013-05-04 LAB — POCT INR: INR: 2.5

## 2013-05-16 ENCOUNTER — Encounter: Payer: Self-pay | Admitting: Cardiology

## 2013-05-16 ENCOUNTER — Ambulatory Visit (INDEPENDENT_AMBULATORY_CARE_PROVIDER_SITE_OTHER): Payer: Medicare Other | Admitting: Cardiology

## 2013-05-16 VITALS — BP 130/90 | HR 80 | Ht 74.0 in | Wt 209.0 lb

## 2013-05-16 DIAGNOSIS — Z7901 Long term (current) use of anticoagulants: Secondary | ICD-10-CM

## 2013-05-16 DIAGNOSIS — R0989 Other specified symptoms and signs involving the circulatory and respiratory systems: Secondary | ICD-10-CM

## 2013-05-16 DIAGNOSIS — C73 Malignant neoplasm of thyroid gland: Secondary | ICD-10-CM | POA: Diagnosis not present

## 2013-05-16 DIAGNOSIS — I4891 Unspecified atrial fibrillation: Secondary | ICD-10-CM | POA: Diagnosis not present

## 2013-05-16 DIAGNOSIS — N189 Chronic kidney disease, unspecified: Secondary | ICD-10-CM | POA: Diagnosis not present

## 2013-05-16 DIAGNOSIS — I10 Essential (primary) hypertension: Secondary | ICD-10-CM | POA: Diagnosis not present

## 2013-05-16 DIAGNOSIS — E039 Hypothyroidism, unspecified: Secondary | ICD-10-CM | POA: Diagnosis not present

## 2013-05-16 DIAGNOSIS — R9430 Abnormal result of cardiovascular function study, unspecified: Secondary | ICD-10-CM

## 2013-05-16 LAB — TSH: TSH: 0.84 u[IU]/mL (ref 0.35–5.50)

## 2013-05-16 MED ORDER — METOPROLOL SUCCINATE ER 50 MG PO TB24: ORAL_TABLET | ORAL | Status: DC

## 2013-05-16 NOTE — Progress Notes (Signed)
Patient ID: Randy [name], male   DOB: 10-Apr-1955, 58 y.o.   MRN: [id number]    HPI  Patient is seen back to followup a history of paroxysmal atrial fibrillation. He is feeling well. Historically he had some atrial fib that converted back to sinus. He has not felt any palpitations. However on physical exam today his rhythm is irregular. He is almost 11 years post renal transplant and he is doing well. He is on Coumadin for his paroxysmal atrial fib.  No Known Allergies  Current Outpatient Prescriptions  Medication Sig Dispense Refill  . amLODipine (NORVASC) 10 MG tablet Take 10 mg by mouth 2 (two) times daily.      Marland Kitchen atorvastatin (LIPITOR) 80 MG tablet Take 80 mg by mouth daily.      . cloNIDine (CATAPRES) 0.3 MG tablet Take 0.3 mg by mouth 2 (two) times daily.      . cycloSPORINE modified (NEORAL) 100 MG capsule Take 100 mg by mouth 2 (two) times daily.      . enalapril (VASOTEC) 5 MG tablet Take 5 mg by mouth at bedtime.       Marland Kitchen glipiZIDE (GLUCOTROL XL) 5 MG 24 hr tablet Take 5 mg by mouth daily.      Marland Kitchen levothyroxine (SYNTHROID, LEVOTHROID) 137 MCG tablet Take 137 mcg by mouth daily before breakfast.      . mycophenolate (MYFORTIC) 360 MG TBEC Take 720 mg by mouth 2 (two) times daily.       . predniSONE (DELTASONE) 5 MG tablet Take 5 mg by mouth daily.      Marland Kitchen warfarin (COUMADIN) 5 MG tablet Take 1 tablet (5 mg total) by mouth daily.  90 tablet  3   No current facility-administered medications for this visit.    History   Social History  . Marital Status: Single    Spouse Name: N/A    Number of Children: N/A  . Years of Education: N/A   Occupational History  . Not on file.   Social History Main Topics  . Smoking status: Never Smoker   . Smokeless tobacco: Not on file  . Alcohol Use: No  . Drug Use: No  . Sexual Activity:    Other Topics Concern  . Not on file   Social History Narrative  . No narrative on file    History reviewed. No pertinent family history.  Past  Medical History  Diagnosis Date  . Chronic kidney disease     dialysis; kidney transplant  2004  . Hypertension   . Multiple thyroid nodules   . Preop cardiovascular exam     Preop clearance for thyroid surgery June, 2013  . Atrial fibrillation     dx 07/2012; CHADS2=2; coumadin started  . Ejection fraction     a. Echo 7/14:  Mod LVH, EF 55-60%, Gr 2 DD, MAC, mild LAE, PASP 32, trivial Eff.  . Thyroid cancer   . Ejection fraction     .    Past Surgical History  Procedure Laterality Date  . Kidney transplant  2004  . Thyroidectomy  08/17/2011    Procedure: THYROIDECTOMY;  [name]: Earnstine Regal, MD;  Location: [facility];  Service: General;  Laterality: N/A;    Patient Active Problem List   Diagnosis Date Noted  . Encounter for therapeutic drug monitoring 03/23/2013  . Warfarin anticoagulation 10/01/2012  . Ejection fraction   . Atrial fibrillation 08/08/2012  . Thyroid cancer 08/27/2011  . Chronic kidney disease   . Hypertension  ROS  Patient denies fever, chills, headache, sweats, rash, change in vision, change in hearing, chest pain, cough, nausea vomiting, urinary symptoms. All other systems are reviewed and are negative.  PHYSICAL EXAM  Patient is oriented to person time and place. Affect is normal. There is no jugulovenous distention. Lungs are clear. Respiratory effort is nonlabored. Cardiac exam reveals S1 and S2. The rhythm is irregularly irregular. Abdomen is soft. There is no purple edema. There no musculoskeletal deformities. There are no skin rashes.  Filed Vitals:   05/16/13 1025  BP: 130/90  Pulse: 80  Height: 6\' 2"  (1.88 m)  Weight: 209 lb (94.802 kg)   EKG is done today and reviewed by me. I have compared it with the past.  ASSESSMENT & PLAN

## 2013-05-16 NOTE — Assessment & Plan Note (Signed)
Blood pressure is controlled today. No change in therapy. 

## 2013-05-16 NOTE — Assessment & Plan Note (Signed)
Patient had surgery for his thyroid cancer. He is followed carefully.

## 2013-05-16 NOTE — Assessment & Plan Note (Signed)
Patient is anticoagulated with Coumadin. This drug will be continued for his atrial fib.

## 2013-05-16 NOTE — Assessment & Plan Note (Addendum)
Patient has a history of paroxysmal atrial fibrillation. In the past he converted spontaneously. When I saw him last his rhythm was regular. EKG today shows atrial fibrillation. The rate by EKG is 115. We will want to slow down his rate. He is anticoagulated. He does not sense the increased heart rate. Therefore I cannot document how long he has had this returned atrial fib or how long he has had this increased rate. TSH will be checked today to be sure that there is not an unusual change as he is on thyroid replacement. He is on amlodipine for his blood pressure. A second calcium channel blocker could be added if needed. I'm hesitant to reduce any of his antihypertensive meds. I've chosen to start first with a beta blocker. We will arrange for followup assessment of his rate and potential adjustments of his meds.

## 2013-05-16 NOTE — Assessment & Plan Note (Signed)
Patient became hypothyroid after his thyroid cancer surgery. With his increased heart rate is TSH will be checked today while he is on Synthroid.  As part of today's evaluation I spent greater than 25 minutes for this total care. More than half of this time is spent with direct contact with him discussing only issues and explaining what we will need to do about his current returned atrophic fibrillation.

## 2013-05-16 NOTE — Assessment & Plan Note (Signed)
The patient is post transplant in 2004. He continues to do well. He follows regularly with nephrology.

## 2013-05-16 NOTE — Assessment & Plan Note (Signed)
In July, 2014, EF was 55-60%. I'm hopeful that he will not have reduction in his ejection fraction with his atrial fibrillation. This will be assessed further later date.

## 2013-05-16 NOTE — Patient Instructions (Signed)
Your physician has recommended you make the following change in your medication: start taking Metoprolol 50 mg daily (take 1/2 tablet (25 mg) for the first 2 days then increase to a whole tablet (50 mg) daily.  Your physician recommends that you return for lab work in: today  Your physician recommends that you schedule a follow-up appointment in: EKG visit in 1 week and follow up with Dr Ron Parker in 3 weeks.

## 2013-05-23 ENCOUNTER — Encounter: Payer: Self-pay | Admitting: Cardiology

## 2013-05-23 ENCOUNTER — Ambulatory Visit (INDEPENDENT_AMBULATORY_CARE_PROVIDER_SITE_OTHER): Payer: Medicare Other | Admitting: *Deleted

## 2013-05-23 DIAGNOSIS — I4891 Unspecified atrial fibrillation: Secondary | ICD-10-CM

## 2013-05-23 NOTE — Progress Notes (Signed)
Patient in today for EKG, shows NSR, HR 60s.  To medical records to be scanned in. Patient reports feeling fine, has no voiced c/o. Taking Toprol XL as ordered at last visit.

## 2013-05-23 NOTE — Progress Notes (Signed)
Let him know that his rhythm was back to regular on today. Thyroid is good.

## 2013-06-15 ENCOUNTER — Ambulatory Visit (INDEPENDENT_AMBULATORY_CARE_PROVIDER_SITE_OTHER): Payer: Medicare Other

## 2013-06-15 DIAGNOSIS — I4891 Unspecified atrial fibrillation: Secondary | ICD-10-CM

## 2013-06-15 DIAGNOSIS — Z7901 Long term (current) use of anticoagulants: Secondary | ICD-10-CM

## 2013-06-15 DIAGNOSIS — Z5181 Encounter for therapeutic drug level monitoring: Secondary | ICD-10-CM

## 2013-06-15 LAB — POCT INR: INR: 3

## 2013-06-18 ENCOUNTER — Ambulatory Visit (INDEPENDENT_AMBULATORY_CARE_PROVIDER_SITE_OTHER): Payer: Medicare Other | Admitting: Cardiology

## 2013-06-18 ENCOUNTER — Encounter: Payer: Self-pay | Admitting: Cardiology

## 2013-06-18 VITALS — BP 118/80 | HR 64 | Ht 74.0 in | Wt 211.8 lb

## 2013-06-18 DIAGNOSIS — E039 Hypothyroidism, unspecified: Secondary | ICD-10-CM

## 2013-06-18 DIAGNOSIS — R0989 Other specified symptoms and signs involving the circulatory and respiratory systems: Secondary | ICD-10-CM | POA: Diagnosis not present

## 2013-06-18 DIAGNOSIS — I10 Essential (primary) hypertension: Secondary | ICD-10-CM

## 2013-06-18 DIAGNOSIS — R9430 Abnormal result of cardiovascular function study, unspecified: Secondary | ICD-10-CM

## 2013-06-18 DIAGNOSIS — I4891 Unspecified atrial fibrillation: Secondary | ICD-10-CM

## 2013-06-18 NOTE — Progress Notes (Signed)
Patient ID: Randy [name], male   DOB: Mar 25, 1955, 58 y.o.   MRN: [id number]    HPI  Patient is seen today to followup atrial fibrillation. When I saw him in the office on May 16, 2013, he was in atrial fibrillation. He did not feel this. His rate was mildly increased. I chose to add a beta blocker to his medications and arrange for followup EKG. His followup EKG revealed sinus rhythm. He is feeling well. He is already anticoagulated with Coumadin due to prior atrial fibrillation. I checked his thyroid status at the last visit and the TSH was normal. He feels well.  No Known Allergies  Current Outpatient Prescriptions  Medication Sig Dispense Refill  . amLODipine (NORVASC) 10 MG tablet Take 10 mg by mouth 2 (two) times daily.      Marland Kitchen atorvastatin (LIPITOR) 80 MG tablet Take 80 mg by mouth daily.      . cloNIDine (CATAPRES) 0.3 MG tablet Take 0.3 mg by mouth 2 (two) times daily.      . cycloSPORINE modified (NEORAL) 100 MG capsule Take 100 mg by mouth 2 (two) times daily.      . enalapril (VASOTEC) 5 MG tablet Take 5 mg by mouth at bedtime.       Marland Kitchen glipiZIDE (GLUCOTROL XL) 5 MG 24 hr tablet Take 5 mg by mouth daily.      Marland Kitchen levothyroxine (SYNTHROID, LEVOTHROID) 137 MCG tablet Take 137 mcg by mouth daily before breakfast.      . metoprolol succinate (TOPROL-XL) 50 MG 24 hr tablet Take 50 mg by mouth daily.      . mycophenolate (MYFORTIC) 360 MG TBEC Take 720 mg by mouth 2 (two) times daily.       . predniSONE (DELTASONE) 5 MG tablet Take 5 mg by mouth daily.      Marland Kitchen warfarin (COUMADIN) 5 MG tablet Take 1 tablet (5 mg total) by mouth daily.  90 tablet  3   No current facility-administered medications for this visit.    History   Social History  . Marital Status: Single    Spouse Name: N/A    Number of Children: N/A  . Years of Education: N/A   Occupational History  . Not on file.   Social History Main Topics  . Smoking status: Never Smoker   . Smokeless tobacco: Not on file  . Alcohol  Use: No  . Drug Use: No  . Sexual Activity:    Other Topics Concern  . Not on file   Social History Narrative  . No narrative on file    No family history on file.  Past Medical History  Diagnosis Date  . Chronic kidney disease     dialysis; kidney transplant  2004  . Hypertension   . Multiple thyroid nodules   . Preop cardiovascular exam     Preop clearance for thyroid surgery June, 2013  . Atrial fibrillation     dx 07/2012; CHADS2=2; coumadin started  . Ejection fraction     a. Echo 7/14:  Mod LVH, EF 55-60%, Gr 2 DD, MAC, mild LAE, PASP 32, trivial Eff.  . Thyroid cancer   . Ejection fraction     .    Past Surgical History  Procedure Laterality Date  . Kidney transplant  2004  . Thyroidectomy  08/17/2011    Procedure: THYROIDECTOMY;  Surgeon: Earnstine Regal, MD;  Location: [facility];  Service: General;  Laterality: N/A;    Patient Active Problem List  Diagnosis Date Noted  . Hypothyroidism (acquired) 05/16/2013  . Encounter for therapeutic drug monitoring 03/23/2013  . Warfarin anticoagulation 10/01/2012  . Ejection fraction   . Atrial fibrillation 08/08/2012  . Thyroid cancer 08/27/2011  . Chronic kidney disease   . Hypertension     ROS   Patient denies fever, chills, headache, sweats, rash, change in vision, change in hearing, chest pain, cough, nausea vomiting, urinary symptoms. All other systems are reviewed and are negative.  PHYSICAL EXAM  Patient is oriented to person time and place. Affect is normal. Head is atraumatic. There is no jugulovenous distention. Lungs are clear. Respiratory effort is nonlabored. Cardiac exam reveals S1 and S2. The rhythm is regular. The abdomen is soft. There is no peripheral edema. There are no musculoskeletal deformities. There are no skin rashes.  Filed Vitals:   06/18/13 0835  BP: 118/80  Pulse: 64  Height: 6\' 2"  (1.88 m)  Weight: 211 lb 12.8 oz (96.072 kg)     ASSESSMENT & PLAN

## 2013-06-18 NOTE — Assessment & Plan Note (Signed)
The patient is back in sinus rhythm. I will continue the newly added beta blocker. He is already anticoagulated. No change in therapy.

## 2013-06-18 NOTE — Patient Instructions (Signed)
Your physician recommends that you continue on your current medications as directed. Please refer to the Current Medication list given to you today.  Your physician wants you to follow-up in: 6 months. You will receive a reminder letter in the mail two months in advance. If you don't receive a letter, please call our office to schedule the follow-up appointment.  

## 2013-06-18 NOTE — Assessment & Plan Note (Signed)
His EF was normal in July, 2014. I feel we do not need to repeat his echo.

## 2013-06-18 NOTE — Assessment & Plan Note (Signed)
Blood pressure is stable. No change

## 2013-06-18 NOTE — Assessment & Plan Note (Signed)
TSH was normal when checked after the last visit.

## 2013-06-25 DIAGNOSIS — N2581 Secondary hyperparathyroidism of renal origin: Secondary | ICD-10-CM | POA: Diagnosis not present

## 2013-06-25 DIAGNOSIS — E139 Other specified diabetes mellitus without complications: Secondary | ICD-10-CM | POA: Diagnosis not present

## 2013-06-25 DIAGNOSIS — Z940 Kidney transplant status: Secondary | ICD-10-CM | POA: Diagnosis not present

## 2013-06-25 DIAGNOSIS — E785 Hyperlipidemia, unspecified: Secondary | ICD-10-CM | POA: Diagnosis not present

## 2013-06-25 DIAGNOSIS — I10 Essential (primary) hypertension: Secondary | ICD-10-CM | POA: Diagnosis not present

## 2013-07-27 ENCOUNTER — Ambulatory Visit (INDEPENDENT_AMBULATORY_CARE_PROVIDER_SITE_OTHER): Payer: Medicare Other

## 2013-07-27 DIAGNOSIS — Z7901 Long term (current) use of anticoagulants: Secondary | ICD-10-CM

## 2013-07-27 DIAGNOSIS — Z5181 Encounter for therapeutic drug level monitoring: Secondary | ICD-10-CM | POA: Diagnosis not present

## 2013-07-27 DIAGNOSIS — I4891 Unspecified atrial fibrillation: Secondary | ICD-10-CM | POA: Diagnosis not present

## 2013-07-27 DIAGNOSIS — Z940 Kidney transplant status: Secondary | ICD-10-CM | POA: Diagnosis not present

## 2013-07-27 LAB — POCT INR: INR: 3.1

## 2013-07-31 ENCOUNTER — Other Ambulatory Visit: Payer: Self-pay | Admitting: Cardiology

## 2013-08-03 DIAGNOSIS — Z940 Kidney transplant status: Secondary | ICD-10-CM | POA: Diagnosis not present

## 2013-09-07 ENCOUNTER — Ambulatory Visit (INDEPENDENT_AMBULATORY_CARE_PROVIDER_SITE_OTHER): Payer: Medicare Other | Admitting: *Deleted

## 2013-09-07 DIAGNOSIS — Z5181 Encounter for therapeutic drug level monitoring: Secondary | ICD-10-CM | POA: Diagnosis not present

## 2013-09-07 DIAGNOSIS — I4891 Unspecified atrial fibrillation: Secondary | ICD-10-CM | POA: Diagnosis not present

## 2013-09-07 DIAGNOSIS — Z7901 Long term (current) use of anticoagulants: Secondary | ICD-10-CM

## 2013-09-07 LAB — POCT INR: INR: 3.5

## 2013-09-21 ENCOUNTER — Ambulatory Visit (INDEPENDENT_AMBULATORY_CARE_PROVIDER_SITE_OTHER): Payer: Medicare Other | Admitting: *Deleted

## 2013-09-21 DIAGNOSIS — Z5181 Encounter for therapeutic drug level monitoring: Secondary | ICD-10-CM | POA: Diagnosis not present

## 2013-09-21 DIAGNOSIS — I4891 Unspecified atrial fibrillation: Secondary | ICD-10-CM | POA: Diagnosis not present

## 2013-09-21 DIAGNOSIS — Z7901 Long term (current) use of anticoagulants: Secondary | ICD-10-CM | POA: Diagnosis not present

## 2013-09-21 LAB — POCT INR: INR: 2.5

## 2013-09-23 ENCOUNTER — Other Ambulatory Visit: Payer: Self-pay | Admitting: Cardiology

## 2013-10-05 ENCOUNTER — Ambulatory Visit (INDEPENDENT_AMBULATORY_CARE_PROVIDER_SITE_OTHER): Payer: Medicare Other | Admitting: *Deleted

## 2013-10-05 DIAGNOSIS — I4891 Unspecified atrial fibrillation: Secondary | ICD-10-CM

## 2013-10-05 DIAGNOSIS — Z7901 Long term (current) use of anticoagulants: Secondary | ICD-10-CM

## 2013-10-05 DIAGNOSIS — Z5181 Encounter for therapeutic drug level monitoring: Secondary | ICD-10-CM

## 2013-10-05 LAB — POCT INR: INR: 3.3

## 2013-10-09 DIAGNOSIS — E139 Other specified diabetes mellitus without complications: Secondary | ICD-10-CM | POA: Diagnosis not present

## 2013-10-09 DIAGNOSIS — I10 Essential (primary) hypertension: Secondary | ICD-10-CM | POA: Diagnosis not present

## 2013-10-09 DIAGNOSIS — Z85850 Personal history of malignant neoplasm of thyroid: Secondary | ICD-10-CM | POA: Diagnosis not present

## 2013-10-09 DIAGNOSIS — Z940 Kidney transplant status: Secondary | ICD-10-CM | POA: Diagnosis not present

## 2013-10-09 DIAGNOSIS — N2581 Secondary hyperparathyroidism of renal origin: Secondary | ICD-10-CM | POA: Diagnosis not present

## 2013-10-09 DIAGNOSIS — E785 Hyperlipidemia, unspecified: Secondary | ICD-10-CM | POA: Diagnosis not present

## 2013-10-18 ENCOUNTER — Ambulatory Visit (INDEPENDENT_AMBULATORY_CARE_PROVIDER_SITE_OTHER): Payer: Medicare Other

## 2013-10-18 DIAGNOSIS — Z7901 Long term (current) use of anticoagulants: Secondary | ICD-10-CM | POA: Diagnosis not present

## 2013-10-18 DIAGNOSIS — I4891 Unspecified atrial fibrillation: Secondary | ICD-10-CM | POA: Diagnosis not present

## 2013-10-18 DIAGNOSIS — Z5181 Encounter for therapeutic drug level monitoring: Secondary | ICD-10-CM

## 2013-10-18 LAB — POCT INR: INR: 3.6

## 2013-10-28 ENCOUNTER — Other Ambulatory Visit: Payer: Self-pay | Admitting: Cardiology

## 2013-10-30 DIAGNOSIS — C73 Malignant neoplasm of thyroid gland: Secondary | ICD-10-CM | POA: Diagnosis not present

## 2013-10-30 DIAGNOSIS — E890 Postprocedural hypothyroidism: Secondary | ICD-10-CM | POA: Diagnosis not present

## 2013-10-31 ENCOUNTER — Telehealth: Payer: Self-pay

## 2013-10-31 NOTE — Telephone Encounter (Signed)
The pt is due to f/u in October and [facility]. It is ok to fill.

## 2013-11-01 ENCOUNTER — Ambulatory Visit (INDEPENDENT_AMBULATORY_CARE_PROVIDER_SITE_OTHER): Payer: Medicare Other | Admitting: *Deleted

## 2013-11-01 DIAGNOSIS — Z7901 Long term (current) use of anticoagulants: Secondary | ICD-10-CM | POA: Diagnosis not present

## 2013-11-01 DIAGNOSIS — I4891 Unspecified atrial fibrillation: Secondary | ICD-10-CM

## 2013-11-01 DIAGNOSIS — Z5181 Encounter for therapeutic drug level monitoring: Secondary | ICD-10-CM

## 2013-11-01 LAB — POCT INR: INR: 2.1

## 2013-11-02 ENCOUNTER — Other Ambulatory Visit: Payer: Self-pay

## 2013-11-02 MED ORDER — METOPROLOL SUCCINATE ER 50 MG PO TB24: ORAL_TABLET | ORAL | Status: DC

## 2013-11-15 ENCOUNTER — Ambulatory Visit (INDEPENDENT_AMBULATORY_CARE_PROVIDER_SITE_OTHER): Payer: Medicare Other

## 2013-11-15 DIAGNOSIS — Z7901 Long term (current) use of anticoagulants: Secondary | ICD-10-CM | POA: Diagnosis not present

## 2013-11-15 DIAGNOSIS — Z5181 Encounter for therapeutic drug level monitoring: Secondary | ICD-10-CM | POA: Diagnosis not present

## 2013-11-15 DIAGNOSIS — I4891 Unspecified atrial fibrillation: Secondary | ICD-10-CM

## 2013-11-15 LAB — POCT INR: INR: 3.7

## 2013-11-21 ENCOUNTER — Ambulatory Visit (INDEPENDENT_AMBULATORY_CARE_PROVIDER_SITE_OTHER): Payer: Medicare Other | Admitting: Cardiology

## 2013-11-21 ENCOUNTER — Encounter: Payer: Self-pay | Admitting: Cardiology

## 2013-11-21 VITALS — BP 140/88 | HR 79 | Ht 74.0 in | Wt 215.0 lb

## 2013-11-21 DIAGNOSIS — I4891 Unspecified atrial fibrillation: Secondary | ICD-10-CM | POA: Diagnosis not present

## 2013-11-21 DIAGNOSIS — Z7901 Long term (current) use of anticoagulants: Secondary | ICD-10-CM | POA: Diagnosis not present

## 2013-11-21 DIAGNOSIS — I10 Essential (primary) hypertension: Secondary | ICD-10-CM | POA: Diagnosis not present

## 2013-11-21 DIAGNOSIS — I480 Paroxysmal atrial fibrillation: Secondary | ICD-10-CM

## 2013-11-21 NOTE — Patient Instructions (Signed)
Your physician recommends that you continue on your current medications as directed. Please refer to the Current Medication list given to you today.  Your physician wants you to follow-up in: 1 year. You will receive a reminder letter in the mail two months in advance. If you don't receive a letter, please call our office to schedule the follow-up appointment.  

## 2013-11-21 NOTE — Progress Notes (Signed)
Patient ID: Randy [name], male   DOB: 1955/09/01, 58 y.o.   MRN: [id number]    HPI  Patient is seen today to follow up as atrial fibrillation. I saw him in March he been in atrial fib. His meds were adjusted. He is already anticoagulated. I then saw him back in April and he was in sinus rhythm. TSH was normal. He's doing fine at this time.  No Known Allergies  Current Outpatient Prescriptions  Medication Sig Dispense Refill  . amLODipine (NORVASC) 10 MG tablet Take 10 mg by mouth 2 (two) times daily.      Marland Kitchen atorvastatin (LIPITOR) 80 MG tablet Take 80 mg by mouth daily.      . cloNIDine (CATAPRES) 0.3 MG tablet Take 0.3 mg by mouth 2 (two) times daily.      . cycloSPORINE modified (NEORAL) 100 MG capsule Take 100 mg by mouth 2 (two) times daily.      . enalapril (VASOTEC) 5 MG tablet Take 5 mg by mouth at bedtime.       Marland Kitchen glipiZIDE (GLUCOTROL XL) 5 MG 24 hr tablet Take 5 mg by mouth daily.      Marland Kitchen levothyroxine (SYNTHROID, LEVOTHROID) 137 MCG tablet Take 137 mcg by mouth daily before breakfast.      . metoprolol succinate (TOPROL-XL) 50 MG 24 hr tablet TAKE ONE TABLET BY MOUTH ONCE DAILY  30 tablet  1  . mycophenolate (MYFORTIC) 360 MG TBEC Take 720 mg by mouth 2 (two) times daily.       . predniSONE (DELTASONE) 5 MG tablet Take 5 mg by mouth daily.      Marland Kitchen warfarin (COUMADIN) 5 MG tablet 1 tablet daily except 1/2 tablet on Wednesdays or as directed by coumadin clinic  90 tablet  1   No current facility-administered medications for this visit.    History   Social History  . Marital Status: Single    Spouse Name: N/A    Number of Children: N/A  . Years of Education: N/A   Occupational History  . Not on file.   Social History Main Topics  . Smoking status: Never Smoker   . Smokeless tobacco: Not on file  . Alcohol Use: No  . Drug Use: No  . Sexual Activity:    Other Topics Concern  . Not on file   Social History Narrative  . No narrative on file    History reviewed. No  pertinent family history.  Past Medical History  Diagnosis Date  . Chronic kidney disease     dialysis; kidney transplant  2004  . Hypertension   . Multiple thyroid nodules   . Preop cardiovascular exam     Preop clearance for thyroid surgery June, 2013  . Atrial fibrillation     dx 07/2012; CHADS2=2; coumadin started  . Ejection fraction     a. Echo 7/14:  Mod LVH, EF 55-60%, Gr 2 DD, MAC, mild LAE, PASP 32, trivial Eff.  . Thyroid cancer   . Ejection fraction     .    Past Surgical History  Procedure Laterality Date  . Kidney transplant  2004  . Thyroidectomy  08/17/2011    Procedure: THYROIDECTOMY;  Surgeon: Earnstine Regal, MD;  Location: [facility];  Service: General;  Laterality: N/A;    Patient Active Problem List   Diagnosis Date Noted  . Hypothyroidism (acquired) 05/16/2013  . Encounter for therapeutic drug monitoring 03/23/2013  . Warfarin anticoagulation 10/01/2012  . Ejection fraction   .  Atrial fibrillation 08/08/2012  . Thyroid cancer 08/27/2011  . Chronic kidney disease   . Hypertension     ROS   Patient denies fever, chills, headache, sweats, rash, change in vision, change in hearing, chest pain, cough, nausea vomiting, urinary symptoms. All other systems are reviewed and are negative.  PHYSICAL EXAM  Patient is oriented to person time and place. Affect is normal. He is overweight but stable. Head is atraumatic. There is no jugulovenous distention. Lungs are clear. Respiratory effort is nonlabored. Cardiac exam reveals S1 and S2. There no clicks or significant murmurs. The abdomen is soft. There is no peripheral edema. The rhythm is regular.    Filed Vitals:   11/21/13 1436  BP: 140/88  Pulse: 79  Height: 6\' 2"  (1.88 m)  Weight: 215 lb (97.523 kg)  SpO2: 97%     ASSESSMENT & PLAN

## 2013-11-21 NOTE — Assessment & Plan Note (Signed)
Coumadin will be continued. No change in therapy.

## 2013-11-21 NOTE — Assessment & Plan Note (Signed)
Blood pressures control. No change in therapy. 

## 2013-11-21 NOTE — Assessment & Plan Note (Signed)
He's holding sinus rhythm at this time. No change in therapy. He is anticoagulated.

## 2013-11-23 DIAGNOSIS — R531 Weakness: Secondary | ICD-10-CM | POA: Diagnosis not present

## 2013-11-23 DIAGNOSIS — R55 Syncope and collapse: Secondary | ICD-10-CM | POA: Diagnosis not present

## 2013-11-29 ENCOUNTER — Ambulatory Visit (INDEPENDENT_AMBULATORY_CARE_PROVIDER_SITE_OTHER): Payer: Medicare Other | Admitting: Pharmacist Clinician (PhC)/ Clinical Pharmacy Specialist

## 2013-11-29 DIAGNOSIS — I4891 Unspecified atrial fibrillation: Secondary | ICD-10-CM

## 2013-11-29 DIAGNOSIS — I480 Paroxysmal atrial fibrillation: Secondary | ICD-10-CM

## 2013-11-29 DIAGNOSIS — Z7901 Long term (current) use of anticoagulants: Secondary | ICD-10-CM

## 2013-11-29 DIAGNOSIS — Z5181 Encounter for therapeutic drug level monitoring: Secondary | ICD-10-CM

## 2013-11-29 LAB — POCT INR: INR: 2.6

## 2014-01-10 ENCOUNTER — Ambulatory Visit (INDEPENDENT_AMBULATORY_CARE_PROVIDER_SITE_OTHER): Payer: Medicare Other | Admitting: *Deleted

## 2014-01-10 DIAGNOSIS — Z5181 Encounter for therapeutic drug level monitoring: Secondary | ICD-10-CM

## 2014-01-10 DIAGNOSIS — I4891 Unspecified atrial fibrillation: Secondary | ICD-10-CM

## 2014-01-10 DIAGNOSIS — Z7901 Long term (current) use of anticoagulants: Secondary | ICD-10-CM

## 2014-01-10 DIAGNOSIS — I480 Paroxysmal atrial fibrillation: Secondary | ICD-10-CM | POA: Diagnosis not present

## 2014-01-10 LAB — POCT INR: INR: 2.2

## 2014-01-31 DIAGNOSIS — E139 Other specified diabetes mellitus without complications: Secondary | ICD-10-CM | POA: Diagnosis not present

## 2014-01-31 DIAGNOSIS — N2581 Secondary hyperparathyroidism of renal origin: Secondary | ICD-10-CM | POA: Diagnosis not present

## 2014-01-31 DIAGNOSIS — Z85850 Personal history of malignant neoplasm of thyroid: Secondary | ICD-10-CM | POA: Diagnosis not present

## 2014-01-31 DIAGNOSIS — I10 Essential (primary) hypertension: Secondary | ICD-10-CM | POA: Diagnosis not present

## 2014-01-31 DIAGNOSIS — E785 Hyperlipidemia, unspecified: Secondary | ICD-10-CM | POA: Diagnosis not present

## 2014-01-31 DIAGNOSIS — Z940 Kidney transplant status: Secondary | ICD-10-CM | POA: Diagnosis not present

## 2014-02-04 ENCOUNTER — Other Ambulatory Visit: Payer: Self-pay | Admitting: Cardiology

## 2014-02-07 ENCOUNTER — Ambulatory Visit (INDEPENDENT_AMBULATORY_CARE_PROVIDER_SITE_OTHER): Payer: Medicare Other | Admitting: *Deleted

## 2014-02-07 DIAGNOSIS — Z5181 Encounter for therapeutic drug level monitoring: Secondary | ICD-10-CM

## 2014-02-07 DIAGNOSIS — Z7901 Long term (current) use of anticoagulants: Secondary | ICD-10-CM

## 2014-02-07 DIAGNOSIS — I4891 Unspecified atrial fibrillation: Secondary | ICD-10-CM | POA: Diagnosis not present

## 2014-02-07 DIAGNOSIS — I480 Paroxysmal atrial fibrillation: Secondary | ICD-10-CM | POA: Diagnosis not present

## 2014-02-07 LAB — POCT INR: INR: 2.9

## 2014-04-11 ENCOUNTER — Ambulatory Visit (INDEPENDENT_AMBULATORY_CARE_PROVIDER_SITE_OTHER): Payer: Medicare Other | Admitting: *Deleted

## 2014-04-11 DIAGNOSIS — Z5181 Encounter for therapeutic drug level monitoring: Secondary | ICD-10-CM

## 2014-04-11 DIAGNOSIS — Z7901 Long term (current) use of anticoagulants: Secondary | ICD-10-CM

## 2014-04-11 DIAGNOSIS — I480 Paroxysmal atrial fibrillation: Secondary | ICD-10-CM

## 2014-04-11 DIAGNOSIS — I4891 Unspecified atrial fibrillation: Secondary | ICD-10-CM | POA: Diagnosis not present

## 2014-04-11 LAB — POCT INR: INR: 3

## 2014-04-23 DIAGNOSIS — E785 Hyperlipidemia, unspecified: Secondary | ICD-10-CM | POA: Diagnosis not present

## 2014-04-23 DIAGNOSIS — Z940 Kidney transplant status: Secondary | ICD-10-CM | POA: Diagnosis not present

## 2014-04-23 DIAGNOSIS — N2581 Secondary hyperparathyroidism of renal origin: Secondary | ICD-10-CM | POA: Diagnosis not present

## 2014-04-23 DIAGNOSIS — I10 Essential (primary) hypertension: Secondary | ICD-10-CM | POA: Diagnosis not present

## 2014-04-26 ENCOUNTER — Encounter: Payer: Self-pay | Admitting: Cardiology

## 2014-05-08 ENCOUNTER — Other Ambulatory Visit: Payer: Self-pay | Admitting: Cardiology

## 2014-05-23 ENCOUNTER — Ambulatory Visit (INDEPENDENT_AMBULATORY_CARE_PROVIDER_SITE_OTHER): Payer: Medicare Other | Admitting: *Deleted

## 2014-05-23 DIAGNOSIS — Z5181 Encounter for therapeutic drug level monitoring: Secondary | ICD-10-CM

## 2014-05-23 DIAGNOSIS — I480 Paroxysmal atrial fibrillation: Secondary | ICD-10-CM | POA: Diagnosis not present

## 2014-05-23 DIAGNOSIS — I4891 Unspecified atrial fibrillation: Secondary | ICD-10-CM | POA: Diagnosis not present

## 2014-05-23 DIAGNOSIS — Z7901 Long term (current) use of anticoagulants: Secondary | ICD-10-CM

## 2014-05-23 LAB — POCT INR: INR: 3.3

## 2014-06-13 ENCOUNTER — Ambulatory Visit (INDEPENDENT_AMBULATORY_CARE_PROVIDER_SITE_OTHER): Payer: Medicare Other

## 2014-06-13 DIAGNOSIS — I4891 Unspecified atrial fibrillation: Secondary | ICD-10-CM

## 2014-06-13 DIAGNOSIS — Z5181 Encounter for therapeutic drug level monitoring: Secondary | ICD-10-CM

## 2014-06-13 DIAGNOSIS — Z7901 Long term (current) use of anticoagulants: Secondary | ICD-10-CM | POA: Diagnosis not present

## 2014-06-13 LAB — POCT INR: INR: 2.3

## 2014-06-26 ENCOUNTER — Other Ambulatory Visit (HOSPITAL_COMMUNITY): Payer: Self-pay | Admitting: Nephrology

## 2014-06-26 DIAGNOSIS — Z940 Kidney transplant status: Secondary | ICD-10-CM

## 2014-07-02 ENCOUNTER — Encounter (HOSPITAL_COMMUNITY)
Admission: RE | Admit: 2014-07-02 | Discharge: 2014-07-02 | Disposition: A | Discharge status: Home / Self Care | Payer: Medicare Other | Source: Ambulatory Visit | Attending: Nephrology | Admitting: Nephrology

## 2014-07-02 DIAGNOSIS — E890 Postprocedural hypothyroidism: Secondary | ICD-10-CM | POA: Diagnosis not present

## 2014-07-02 DIAGNOSIS — E213 Hyperparathyroidism, unspecified: Secondary | ICD-10-CM | POA: Diagnosis not present

## 2014-07-02 DIAGNOSIS — Z940 Kidney transplant status: Secondary | ICD-10-CM | POA: Insufficient documentation

## 2014-07-02 MED ORDER — TECHNETIUM TC 99M SESTAMIBI GENERIC - CARDIOLITE
25.0000 | Freq: Once | INTRAVENOUS | Status: AC | PRN
  Administered 2014-07-02: 25 via INTRAVENOUS

## 2014-07-11 ENCOUNTER — Ambulatory Visit (INDEPENDENT_AMBULATORY_CARE_PROVIDER_SITE_OTHER): Payer: Medicare Other | Admitting: *Deleted

## 2014-07-11 DIAGNOSIS — Z7901 Long term (current) use of anticoagulants: Secondary | ICD-10-CM | POA: Diagnosis not present

## 2014-07-11 DIAGNOSIS — Z5181 Encounter for therapeutic drug level monitoring: Secondary | ICD-10-CM

## 2014-07-11 DIAGNOSIS — I4891 Unspecified atrial fibrillation: Secondary | ICD-10-CM | POA: Diagnosis not present

## 2014-07-11 LAB — POCT INR: INR: 2.8

## 2014-07-26 DIAGNOSIS — Z940 Kidney transplant status: Secondary | ICD-10-CM | POA: Diagnosis not present

## 2014-07-26 DIAGNOSIS — E785 Hyperlipidemia, unspecified: Secondary | ICD-10-CM | POA: Diagnosis not present

## 2014-07-26 DIAGNOSIS — Z85850 Personal history of malignant neoplasm of thyroid: Secondary | ICD-10-CM | POA: Diagnosis not present

## 2014-07-26 DIAGNOSIS — N2581 Secondary hyperparathyroidism of renal origin: Secondary | ICD-10-CM | POA: Diagnosis not present

## 2014-07-26 DIAGNOSIS — I10 Essential (primary) hypertension: Secondary | ICD-10-CM | POA: Diagnosis not present

## 2014-07-26 DIAGNOSIS — E139 Other specified diabetes mellitus without complications: Secondary | ICD-10-CM | POA: Diagnosis not present

## 2014-08-15 ENCOUNTER — Ambulatory Visit (INDEPENDENT_AMBULATORY_CARE_PROVIDER_SITE_OTHER): Payer: Medicare Other | Admitting: *Deleted

## 2014-08-15 DIAGNOSIS — Z5181 Encounter for therapeutic drug level monitoring: Secondary | ICD-10-CM | POA: Diagnosis not present

## 2014-08-15 DIAGNOSIS — I4891 Unspecified atrial fibrillation: Secondary | ICD-10-CM | POA: Diagnosis not present

## 2014-08-15 DIAGNOSIS — Z7901 Long term (current) use of anticoagulants: Secondary | ICD-10-CM

## 2014-08-15 LAB — POCT INR: INR: 2.2

## 2014-08-16 ENCOUNTER — Other Ambulatory Visit: Payer: Self-pay | Admitting: Cardiology

## 2014-08-16 NOTE — Telephone Encounter (Signed)
Patient refill came through refill pool. I know we are not suppose to refill coumadin. Could you please refill for me? Thank you for your time.

## 2014-08-27 DIAGNOSIS — Z940 Kidney transplant status: Secondary | ICD-10-CM | POA: Diagnosis not present

## 2014-09-26 ENCOUNTER — Ambulatory Visit (INDEPENDENT_AMBULATORY_CARE_PROVIDER_SITE_OTHER): Payer: Medicare Other | Admitting: *Deleted

## 2014-09-26 DIAGNOSIS — Z5181 Encounter for therapeutic drug level monitoring: Secondary | ICD-10-CM

## 2014-09-26 DIAGNOSIS — I4891 Unspecified atrial fibrillation: Secondary | ICD-10-CM | POA: Diagnosis not present

## 2014-09-26 DIAGNOSIS — Z7901 Long term (current) use of anticoagulants: Secondary | ICD-10-CM | POA: Diagnosis not present

## 2014-09-26 LAB — POCT INR: INR: 2.1

## 2014-10-29 DIAGNOSIS — E139 Other specified diabetes mellitus without complications: Secondary | ICD-10-CM | POA: Diagnosis not present

## 2014-10-29 DIAGNOSIS — Z940 Kidney transplant status: Secondary | ICD-10-CM | POA: Diagnosis not present

## 2014-10-29 DIAGNOSIS — I10 Essential (primary) hypertension: Secondary | ICD-10-CM | POA: Diagnosis not present

## 2014-10-29 DIAGNOSIS — N2581 Secondary hyperparathyroidism of renal origin: Secondary | ICD-10-CM | POA: Diagnosis not present

## 2014-10-29 DIAGNOSIS — Z85850 Personal history of malignant neoplasm of thyroid: Secondary | ICD-10-CM | POA: Diagnosis not present

## 2014-11-07 ENCOUNTER — Ambulatory Visit (INDEPENDENT_AMBULATORY_CARE_PROVIDER_SITE_OTHER): Payer: Medicare Other

## 2014-11-07 DIAGNOSIS — Z7901 Long term (current) use of anticoagulants: Secondary | ICD-10-CM | POA: Diagnosis not present

## 2014-11-07 DIAGNOSIS — I4891 Unspecified atrial fibrillation: Secondary | ICD-10-CM | POA: Diagnosis not present

## 2014-11-07 DIAGNOSIS — Z5181 Encounter for therapeutic drug level monitoring: Secondary | ICD-10-CM | POA: Diagnosis not present

## 2014-11-07 LAB — POCT INR: INR: 2.9

## 2014-11-08 ENCOUNTER — Encounter: Payer: Self-pay | Admitting: Physician Assistant

## 2014-11-11 ENCOUNTER — Other Ambulatory Visit: Payer: Self-pay | Admitting: Cardiology

## 2014-11-21 ENCOUNTER — Encounter: Payer: Self-pay | Admitting: Gastroenterology

## 2014-11-26 ENCOUNTER — Ambulatory Visit: Payer: Medicare Other | Admitting: Physician Assistant

## 2014-12-30 DIAGNOSIS — D225 Melanocytic nevi of trunk: Secondary | ICD-10-CM | POA: Diagnosis not present

## 2014-12-30 DIAGNOSIS — L821 Other seborrheic keratosis: Secondary | ICD-10-CM | POA: Diagnosis not present

## 2014-12-30 DIAGNOSIS — B360 Pityriasis versicolor: Secondary | ICD-10-CM | POA: Diagnosis not present

## 2014-12-30 DIAGNOSIS — D2361 Other benign neoplasm of skin of right upper limb, including shoulder: Secondary | ICD-10-CM | POA: Diagnosis not present

## 2015-01-02 DIAGNOSIS — E1165 Type 2 diabetes mellitus with hyperglycemia: Secondary | ICD-10-CM | POA: Diagnosis not present

## 2015-01-02 DIAGNOSIS — I10 Essential (primary) hypertension: Secondary | ICD-10-CM | POA: Diagnosis not present

## 2015-01-02 DIAGNOSIS — C73 Malignant neoplasm of thyroid gland: Secondary | ICD-10-CM | POA: Diagnosis not present

## 2015-01-02 DIAGNOSIS — E890 Postprocedural hypothyroidism: Secondary | ICD-10-CM | POA: Diagnosis not present

## 2015-01-23 DIAGNOSIS — Z85850 Personal history of malignant neoplasm of thyroid: Secondary | ICD-10-CM | POA: Diagnosis not present

## 2015-01-23 DIAGNOSIS — E785 Hyperlipidemia, unspecified: Secondary | ICD-10-CM | POA: Diagnosis not present

## 2015-01-23 DIAGNOSIS — E139 Other specified diabetes mellitus without complications: Secondary | ICD-10-CM | POA: Diagnosis not present

## 2015-01-23 DIAGNOSIS — N2581 Secondary hyperparathyroidism of renal origin: Secondary | ICD-10-CM | POA: Diagnosis not present

## 2015-01-23 DIAGNOSIS — I10 Essential (primary) hypertension: Secondary | ICD-10-CM | POA: Diagnosis not present

## 2015-01-23 DIAGNOSIS — Z940 Kidney transplant status: Secondary | ICD-10-CM | POA: Diagnosis not present

## 2015-01-23 DIAGNOSIS — I480 Paroxysmal atrial fibrillation: Secondary | ICD-10-CM | POA: Diagnosis not present

## 2015-02-11 ENCOUNTER — Other Ambulatory Visit: Payer: Self-pay | Admitting: Cardiology

## 2015-02-12 ENCOUNTER — Ambulatory Visit (INDEPENDENT_AMBULATORY_CARE_PROVIDER_SITE_OTHER): Payer: Medicare Other | Admitting: *Deleted

## 2015-02-12 DIAGNOSIS — I4891 Unspecified atrial fibrillation: Secondary | ICD-10-CM

## 2015-02-12 DIAGNOSIS — Z5181 Encounter for therapeutic drug level monitoring: Secondary | ICD-10-CM

## 2015-02-12 DIAGNOSIS — Z7901 Long term (current) use of anticoagulants: Secondary | ICD-10-CM

## 2015-02-12 LAB — POCT INR: INR: 2.2

## 2015-03-14 ENCOUNTER — Encounter: Payer: Self-pay | Admitting: Cardiovascular Disease

## 2015-03-14 NOTE — Progress Notes (Signed)
Patient ID: Randy [name], male   DOB: [birth year], 60 y.o.   MRN: [id number]    HPI  60 y.o. previously seen by Dr Ron Parker with PAF    This patients CHA2DS2-VASc Score and unadjusted Ischemic Stroke Rate (% per year) is equal to 2.2 % stroke rate/year from a score of 2  Above score calculated as 1 point each if present [CHF, HTN, DM, Vascular=MI/PAD/Aortic Plaque, Age if 65-74, or Male] Above score calculated as 2 points each if present [Age > 75, or Stroke/TIA/TE]  Diagnosed June 2014  Converted spontaneously    Echo 08/23/12 reviewed:  Study Conclusions  - Left ventricle: The cavity size was normal. Wall thickness was increased in a pattern of moderate LVH. Systolic function was normal. The estimated ejection fraction was in the range of 55% to 60%. Wall motion was normal; there were no regional wall motion abnormalities. Features are consistent with a pseudonormal left ventricular filling pattern, with concomitant abnormal relaxation and increased filling pressure (grade 2 diastolic dysfunction). - Mitral valve: Calcified annulus. - Left atrium: The atrium was mildly dilated. - Pulmonary arteries: PA peak pressure: 104mm Hg (S). - Pericardium, extracardiac: A trivial pericardial effusion was identified.  Post renal transplant at [facility] .  Thyroid cancer followed by [facility]   No Known Allergies  Current Outpatient Prescriptions  Medication Sig Dispense Refill  . amLODipine (NORVASC) 10 MG tablet Take 10 mg by mouth 2 (two) times daily.    Marland Kitchen atorvastatin (LIPITOR) 80 MG tablet Take 80 mg by mouth daily.    . cloNIDine (CATAPRES) 0.3 MG tablet Take 0.3 mg by mouth 2 (two) times daily.    . cycloSPORINE modified (NEORAL) 100 MG capsule Take 125 mg by mouth 2 (two) times daily.     . enalapril (VASOTEC) 5 MG tablet Take 5 mg by mouth at bedtime.     Marland Kitchen glipiZIDE (GLUCOTROL XL) 5 MG 24 hr tablet Take 5 mg by mouth daily.    Marland Kitchen levothyroxine (SYNTHROID, LEVOTHROID)  137 MCG tablet Take 137 mcg by mouth daily before breakfast.    . metoprolol succinate (TOPROL-XL) 50 MG 24 hr tablet TAKE ONE TABLET BY MOUTH ONCE DAILY 30 tablet 0  . mycophenolate (MYFORTIC) 360 MG TBEC Take 720 mg by mouth 2 (two) times daily.     . predniSONE (DELTASONE) 5 MG tablet Take 5 mg by mouth daily.    Marland Kitchen warfarin (COUMADIN) 5 MG tablet USE AS DIRECTED BY  COUMADIN  CLINIC 30 tablet 1   No current facility-administered medications for this visit.    Social History   Social History  . Marital Status: Single    Spouse Name: N/A  . Number of Children: N/A  . Years of Education: N/A   Occupational History  . Not on file.   Social History Main Topics  . Smoking status: Never Smoker   . Smokeless tobacco: Not on file  . Alcohol Use: No  . Drug Use: No  . Sexual Activity: Not on file   Other Topics Concern  . Not on file   Social History Narrative    Family History  Problem Relation Age of Onset  . Stroke Father     Past Medical History  Diagnosis Date  . Chronic kidney disease     dialysis; kidney transplant  2004  . Hypertension   . Multiple thyroid nodules   . Preop cardiovascular exam     Preop clearance for thyroid surgery June, 2013  . Atrial fibrillation (  [place])     dx 07/2012; CHADS2=2; coumadin started  . Ejection fraction     a. Echo 7/14:  Mod LVH, EF 55-60%, Gr 2 DD, MAC, mild LAE, PASP 32, trivial Eff.  . Thyroid cancer ([place])   . Ejection fraction     .  Marland Kitchen Hyperparathyroidism ([place])   . Dyslipidemia   . 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 History of verruca vulgaris   . Vitiligo   . Vitamin D deficiency   . Hypercalcemia   . PAF (paroxysmal atrial fibrillation) [facility])     Past Surgical History  Procedure Laterality Date  . Kidney transplant  2004  . Thyroidectomy  08/17/2011    Procedure: THYROIDECTOMY;  Surgeon: Earnstine Regal, MD;  Location: [facility];  Service: General;  Laterality: N/A;    Patient Active Problem List    Diagnosis Date Noted  . Hypothyroidism (acquired) 05/16/2013  . Encounter for therapeutic drug monitoring 03/23/2013  . Warfarin anticoagulation 10/01/2012  . Ejection fraction   . Atrial fibrillation ([place]) 08/08/2012  . Thyroid cancer ([place]) 08/27/2011  . Chronic kidney disease   . Hypertension     ROS   Patient denies fever, chills, headache, sweats, rash, change in vision, change in hearing, chest pain, cough, nausea vomiting, urinary symptoms. All other systems are reviewed and are negative.  PHYSICAL EXAM Affect appropriate Healthy:  appears stated age 32: normal Neck supple with no adenopathy JVP normal no bruits no thyromegaly Lungs clear with no wheezing and good diaphragmatic motion Heart:  S1/S2 no murmur, no rub, gallop or click PMI normal Abdomen: benighn, BS positve, no tenderness, no AAA cadaveric kidney RLQ no bruit.  No HSM or HJR Distal pulses intact with no bruits Aneurysmal fistula LUE with thrill  No edema Neuro non-focal Skin warm and dry No muscular weakness   ECG:  05/23/13  SR rate 60 LVH   03/17/15  SB rate 51  LVH nonspecific lateral T wave changes   Filed Vitals:   03/17/15 0957  BP: 110/80  Pulse: 55  Height: 6\' 2"  (1.88 m)  Weight: 94.802 kg (209 lb)  SpO2: 95%     ASSESSMENT & PLAN CRF:  Post transplant f/u [place] [facility] Cyclosporin dose was too low no better.  Cr 1.38-1.8 stable Thyroid: post surgery for cancer on synthroid replacement follow Calcium PAF:  maint NSR f/u coumadin clinic no bleeding issues  HTN:  Well controlled.  Continue current medications and low sodium Dash type diet.   DM:  Discussed low carb diet.  Target hemoglobin A1c is 6.5 or less.  Continue current medications.  F.u with me in a year  Jenkins Rouge

## 2015-03-17 ENCOUNTER — Ambulatory Visit (INDEPENDENT_AMBULATORY_CARE_PROVIDER_SITE_OTHER): Payer: Medicare Other | Admitting: Cardiovascular Disease

## 2015-03-17 ENCOUNTER — Encounter: Payer: Self-pay | Admitting: Cardiovascular Disease

## 2015-03-17 VITALS — BP 110/80 | HR 55 | Ht 74.0 in | Wt 209.0 lb

## 2015-03-17 DIAGNOSIS — Z7901 Long term (current) use of anticoagulants: Secondary | ICD-10-CM

## 2015-03-17 DIAGNOSIS — I4891 Unspecified atrial fibrillation: Secondary | ICD-10-CM | POA: Diagnosis not present

## 2015-03-17 MED ORDER — METOPROLOL SUCCINATE ER 50 MG PO TB24: 50.0000 mg | ORAL_TABLET | Freq: Every day | ORAL | Status: DC

## 2015-03-17 NOTE — Patient Instructions (Addendum)

## 2015-03-26 ENCOUNTER — Ambulatory Visit (INDEPENDENT_AMBULATORY_CARE_PROVIDER_SITE_OTHER): Payer: Medicare Other | Admitting: *Deleted

## 2015-03-26 DIAGNOSIS — I4891 Unspecified atrial fibrillation: Secondary | ICD-10-CM

## 2015-03-26 DIAGNOSIS — Z7901 Long term (current) use of anticoagulants: Secondary | ICD-10-CM | POA: Diagnosis not present

## 2015-03-26 DIAGNOSIS — Z5181 Encounter for therapeutic drug level monitoring: Secondary | ICD-10-CM

## 2015-03-26 LAB — POCT INR: INR: 1.9

## 2015-04-25 ENCOUNTER — Other Ambulatory Visit: Payer: Self-pay | Admitting: Cardiology

## 2015-04-30 ENCOUNTER — Ambulatory Visit (INDEPENDENT_AMBULATORY_CARE_PROVIDER_SITE_OTHER): Payer: Medicare Other | Admitting: *Deleted

## 2015-04-30 DIAGNOSIS — I4891 Unspecified atrial fibrillation: Secondary | ICD-10-CM | POA: Diagnosis not present

## 2015-04-30 DIAGNOSIS — Z5181 Encounter for therapeutic drug level monitoring: Secondary | ICD-10-CM | POA: Diagnosis not present

## 2015-04-30 DIAGNOSIS — Z7901 Long term (current) use of anticoagulants: Secondary | ICD-10-CM

## 2015-04-30 LAB — POCT INR: INR: 2.3

## 2015-06-11 ENCOUNTER — Ambulatory Visit (INDEPENDENT_AMBULATORY_CARE_PROVIDER_SITE_OTHER): Payer: Medicare Other | Admitting: *Deleted

## 2015-06-11 DIAGNOSIS — Z7901 Long term (current) use of anticoagulants: Secondary | ICD-10-CM | POA: Diagnosis not present

## 2015-06-11 DIAGNOSIS — I4891 Unspecified atrial fibrillation: Secondary | ICD-10-CM | POA: Diagnosis not present

## 2015-06-11 DIAGNOSIS — Z5181 Encounter for therapeutic drug level monitoring: Secondary | ICD-10-CM | POA: Diagnosis not present

## 2015-06-11 LAB — POCT INR: INR: 3.1

## 2015-07-22 DIAGNOSIS — I480 Paroxysmal atrial fibrillation: Secondary | ICD-10-CM | POA: Diagnosis not present

## 2015-07-22 DIAGNOSIS — E785 Hyperlipidemia, unspecified: Secondary | ICD-10-CM | POA: Diagnosis not present

## 2015-07-22 DIAGNOSIS — N2581 Secondary hyperparathyroidism of renal origin: Secondary | ICD-10-CM | POA: Diagnosis not present

## 2015-07-22 DIAGNOSIS — E139 Other specified diabetes mellitus without complications: Secondary | ICD-10-CM | POA: Diagnosis not present

## 2015-07-22 DIAGNOSIS — Z940 Kidney transplant status: Secondary | ICD-10-CM | POA: Diagnosis not present

## 2015-07-22 DIAGNOSIS — Z85850 Personal history of malignant neoplasm of thyroid: Secondary | ICD-10-CM | POA: Diagnosis not present

## 2015-07-22 DIAGNOSIS — I10 Essential (primary) hypertension: Secondary | ICD-10-CM | POA: Diagnosis not present

## 2015-07-23 ENCOUNTER — Ambulatory Visit (INDEPENDENT_AMBULATORY_CARE_PROVIDER_SITE_OTHER): Payer: Medicare Other | Admitting: *Deleted

## 2015-07-23 DIAGNOSIS — Z5181 Encounter for therapeutic drug level monitoring: Secondary | ICD-10-CM | POA: Diagnosis not present

## 2015-07-23 DIAGNOSIS — I4891 Unspecified atrial fibrillation: Secondary | ICD-10-CM | POA: Diagnosis not present

## 2015-07-23 DIAGNOSIS — Z7901 Long term (current) use of anticoagulants: Secondary | ICD-10-CM | POA: Diagnosis not present

## 2015-07-23 LAB — POCT INR: INR: 1.5

## 2015-07-30 ENCOUNTER — Ambulatory Visit (INDEPENDENT_AMBULATORY_CARE_PROVIDER_SITE_OTHER): Payer: Medicare Other | Admitting: *Deleted

## 2015-07-30 DIAGNOSIS — I4891 Unspecified atrial fibrillation: Secondary | ICD-10-CM | POA: Diagnosis not present

## 2015-07-30 DIAGNOSIS — Z7901 Long term (current) use of anticoagulants: Secondary | ICD-10-CM

## 2015-07-30 DIAGNOSIS — Z5181 Encounter for therapeutic drug level monitoring: Secondary | ICD-10-CM | POA: Diagnosis not present

## 2015-07-30 DIAGNOSIS — Z940 Kidney transplant status: Secondary | ICD-10-CM | POA: Diagnosis not present

## 2015-07-30 LAB — POCT INR: INR: 2.5

## 2015-08-01 ENCOUNTER — Other Ambulatory Visit: Payer: Self-pay | Admitting: Cardiovascular Disease

## 2015-08-20 ENCOUNTER — Ambulatory Visit (INDEPENDENT_AMBULATORY_CARE_PROVIDER_SITE_OTHER): Payer: Medicare Other | Admitting: *Deleted

## 2015-08-20 DIAGNOSIS — Z5181 Encounter for therapeutic drug level monitoring: Secondary | ICD-10-CM

## 2015-08-20 DIAGNOSIS — Z7901 Long term (current) use of anticoagulants: Secondary | ICD-10-CM

## 2015-08-20 DIAGNOSIS — I4891 Unspecified atrial fibrillation: Secondary | ICD-10-CM | POA: Diagnosis not present

## 2015-08-20 LAB — POCT INR: INR: 1.9

## 2015-09-10 ENCOUNTER — Ambulatory Visit (INDEPENDENT_AMBULATORY_CARE_PROVIDER_SITE_OTHER): Payer: Medicare Other | Admitting: *Deleted

## 2015-09-10 DIAGNOSIS — Z5181 Encounter for therapeutic drug level monitoring: Secondary | ICD-10-CM | POA: Diagnosis not present

## 2015-09-10 DIAGNOSIS — Z7901 Long term (current) use of anticoagulants: Secondary | ICD-10-CM

## 2015-09-10 DIAGNOSIS — I4891 Unspecified atrial fibrillation: Secondary | ICD-10-CM

## 2015-09-10 LAB — POCT INR: INR: 1.4

## 2015-09-18 ENCOUNTER — Ambulatory Visit (INDEPENDENT_AMBULATORY_CARE_PROVIDER_SITE_OTHER): Payer: Medicare Other | Admitting: *Deleted

## 2015-09-18 DIAGNOSIS — Z7901 Long term (current) use of anticoagulants: Secondary | ICD-10-CM | POA: Diagnosis not present

## 2015-09-18 DIAGNOSIS — Z5181 Encounter for therapeutic drug level monitoring: Secondary | ICD-10-CM | POA: Diagnosis not present

## 2015-09-18 DIAGNOSIS — I4891 Unspecified atrial fibrillation: Secondary | ICD-10-CM

## 2015-09-18 LAB — POCT INR: INR: 2.3

## 2015-10-01 ENCOUNTER — Encounter (INDEPENDENT_AMBULATORY_CARE_PROVIDER_SITE_OTHER): Payer: Self-pay

## 2015-10-01 ENCOUNTER — Ambulatory Visit (INDEPENDENT_AMBULATORY_CARE_PROVIDER_SITE_OTHER): Payer: Medicare Other | Admitting: *Deleted

## 2015-10-01 DIAGNOSIS — Z7901 Long term (current) use of anticoagulants: Secondary | ICD-10-CM

## 2015-10-01 DIAGNOSIS — I4891 Unspecified atrial fibrillation: Secondary | ICD-10-CM

## 2015-10-01 DIAGNOSIS — Z5181 Encounter for therapeutic drug level monitoring: Secondary | ICD-10-CM | POA: Diagnosis not present

## 2015-10-01 LAB — POCT INR: INR: 3

## 2015-10-20 DIAGNOSIS — Z85850 Personal history of malignant neoplasm of thyroid: Secondary | ICD-10-CM | POA: Diagnosis not present

## 2015-10-20 DIAGNOSIS — I10 Essential (primary) hypertension: Secondary | ICD-10-CM | POA: Diagnosis not present

## 2015-10-20 DIAGNOSIS — N2581 Secondary hyperparathyroidism of renal origin: Secondary | ICD-10-CM | POA: Diagnosis not present

## 2015-10-20 DIAGNOSIS — I480 Paroxysmal atrial fibrillation: Secondary | ICD-10-CM | POA: Diagnosis not present

## 2015-10-20 DIAGNOSIS — Z6826 Body mass index (BMI) 26.0-26.9, adult: Secondary | ICD-10-CM | POA: Diagnosis not present

## 2015-10-20 DIAGNOSIS — Z940 Kidney transplant status: Secondary | ICD-10-CM | POA: Diagnosis not present

## 2015-10-20 DIAGNOSIS — E785 Hyperlipidemia, unspecified: Secondary | ICD-10-CM | POA: Diagnosis not present

## 2015-10-20 DIAGNOSIS — M170 Bilateral primary osteoarthritis of knee: Secondary | ICD-10-CM | POA: Diagnosis not present

## 2015-10-20 DIAGNOSIS — E139 Other specified diabetes mellitus without complications: Secondary | ICD-10-CM | POA: Diagnosis not present

## 2015-10-29 ENCOUNTER — Ambulatory Visit (INDEPENDENT_AMBULATORY_CARE_PROVIDER_SITE_OTHER): Payer: Medicare Other | Admitting: *Deleted

## 2015-10-29 DIAGNOSIS — Z7901 Long term (current) use of anticoagulants: Secondary | ICD-10-CM

## 2015-10-29 DIAGNOSIS — Z5181 Encounter for therapeutic drug level monitoring: Secondary | ICD-10-CM | POA: Diagnosis not present

## 2015-10-29 DIAGNOSIS — I4891 Unspecified atrial fibrillation: Secondary | ICD-10-CM | POA: Diagnosis not present

## 2015-10-29 LAB — POCT INR: INR: 3.3

## 2015-11-12 ENCOUNTER — Ambulatory Visit (INDEPENDENT_AMBULATORY_CARE_PROVIDER_SITE_OTHER): Payer: Medicare Other | Admitting: *Deleted

## 2015-11-12 DIAGNOSIS — Z5181 Encounter for therapeutic drug level monitoring: Secondary | ICD-10-CM | POA: Diagnosis not present

## 2015-11-12 DIAGNOSIS — I4891 Unspecified atrial fibrillation: Secondary | ICD-10-CM | POA: Diagnosis not present

## 2015-11-12 DIAGNOSIS — Z7901 Long term (current) use of anticoagulants: Secondary | ICD-10-CM

## 2015-11-12 LAB — POCT INR: INR: 2.8

## 2015-12-10 ENCOUNTER — Ambulatory Visit (INDEPENDENT_AMBULATORY_CARE_PROVIDER_SITE_OTHER): Payer: Medicare Other | Admitting: *Deleted

## 2015-12-10 DIAGNOSIS — Z5181 Encounter for therapeutic drug level monitoring: Secondary | ICD-10-CM | POA: Diagnosis not present

## 2015-12-10 DIAGNOSIS — Z7901 Long term (current) use of anticoagulants: Secondary | ICD-10-CM

## 2015-12-10 DIAGNOSIS — I4891 Unspecified atrial fibrillation: Secondary | ICD-10-CM

## 2015-12-10 LAB — POCT INR: INR: 2.2

## 2015-12-11 ENCOUNTER — Other Ambulatory Visit: Payer: Self-pay | Admitting: Cardiovascular Disease

## 2015-12-25 DIAGNOSIS — Z85850 Personal history of malignant neoplasm of thyroid: Secondary | ICD-10-CM | POA: Diagnosis not present

## 2015-12-25 DIAGNOSIS — M170 Bilateral primary osteoarthritis of knee: Secondary | ICD-10-CM | POA: Diagnosis not present

## 2015-12-25 DIAGNOSIS — Z125 Encounter for screening for malignant neoplasm of prostate: Secondary | ICD-10-CM | POA: Diagnosis not present

## 2015-12-25 DIAGNOSIS — Z0000 Encounter for general adult medical examination without abnormal findings: Secondary | ICD-10-CM | POA: Diagnosis not present

## 2015-12-25 DIAGNOSIS — N2581 Secondary hyperparathyroidism of renal origin: Secondary | ICD-10-CM | POA: Diagnosis not present

## 2015-12-25 DIAGNOSIS — I480 Paroxysmal atrial fibrillation: Secondary | ICD-10-CM | POA: Diagnosis not present

## 2015-12-25 DIAGNOSIS — Z6826 Body mass index (BMI) 26.0-26.9, adult: Secondary | ICD-10-CM | POA: Diagnosis not present

## 2015-12-25 DIAGNOSIS — Z940 Kidney transplant status: Secondary | ICD-10-CM | POA: Diagnosis not present

## 2015-12-25 DIAGNOSIS — E785 Hyperlipidemia, unspecified: Secondary | ICD-10-CM | POA: Diagnosis not present

## 2015-12-25 DIAGNOSIS — E139 Other specified diabetes mellitus without complications: Secondary | ICD-10-CM | POA: Diagnosis not present

## 2015-12-25 DIAGNOSIS — I10 Essential (primary) hypertension: Secondary | ICD-10-CM | POA: Diagnosis not present

## 2016-01-05 DIAGNOSIS — E1165 Type 2 diabetes mellitus with hyperglycemia: Secondary | ICD-10-CM | POA: Diagnosis not present

## 2016-01-05 DIAGNOSIS — I10 Essential (primary) hypertension: Secondary | ICD-10-CM | POA: Diagnosis not present

## 2016-01-05 DIAGNOSIS — E890 Postprocedural hypothyroidism: Secondary | ICD-10-CM | POA: Diagnosis not present

## 2016-01-05 DIAGNOSIS — C73 Malignant neoplasm of thyroid gland: Secondary | ICD-10-CM | POA: Diagnosis not present

## 2016-01-07 ENCOUNTER — Ambulatory Visit (INDEPENDENT_AMBULATORY_CARE_PROVIDER_SITE_OTHER): Payer: Medicare Other | Admitting: *Deleted

## 2016-01-07 DIAGNOSIS — I4891 Unspecified atrial fibrillation: Secondary | ICD-10-CM

## 2016-01-07 DIAGNOSIS — Z5181 Encounter for therapeutic drug level monitoring: Secondary | ICD-10-CM

## 2016-01-07 DIAGNOSIS — Z7901 Long term (current) use of anticoagulants: Secondary | ICD-10-CM | POA: Diagnosis not present

## 2016-01-07 LAB — POCT INR: INR: 2.6

## 2016-02-04 ENCOUNTER — Ambulatory Visit (INDEPENDENT_AMBULATORY_CARE_PROVIDER_SITE_OTHER): Payer: Medicare Other | Admitting: Pharmacist

## 2016-02-04 DIAGNOSIS — I4891 Unspecified atrial fibrillation: Secondary | ICD-10-CM | POA: Diagnosis not present

## 2016-02-04 DIAGNOSIS — Z7901 Long term (current) use of anticoagulants: Secondary | ICD-10-CM

## 2016-02-04 DIAGNOSIS — Z5181 Encounter for therapeutic drug level monitoring: Secondary | ICD-10-CM | POA: Diagnosis not present

## 2016-02-04 LAB — POCT INR: INR: 2.4

## 2016-03-17 ENCOUNTER — Ambulatory Visit (INDEPENDENT_AMBULATORY_CARE_PROVIDER_SITE_OTHER): Payer: Medicare Other | Admitting: Pharmacist

## 2016-03-17 DIAGNOSIS — I4891 Unspecified atrial fibrillation: Secondary | ICD-10-CM | POA: Diagnosis not present

## 2016-03-17 DIAGNOSIS — Z7901 Long term (current) use of anticoagulants: Secondary | ICD-10-CM | POA: Diagnosis not present

## 2016-03-17 DIAGNOSIS — Z5181 Encounter for therapeutic drug level monitoring: Secondary | ICD-10-CM | POA: Diagnosis not present

## 2016-03-17 LAB — POCT INR: INR: 2.3

## 2016-04-22 DIAGNOSIS — N2581 Secondary hyperparathyroidism of renal origin: Secondary | ICD-10-CM | POA: Diagnosis not present

## 2016-04-22 DIAGNOSIS — E785 Hyperlipidemia, unspecified: Secondary | ICD-10-CM | POA: Diagnosis not present

## 2016-04-22 DIAGNOSIS — Z6826 Body mass index (BMI) 26.0-26.9, adult: Secondary | ICD-10-CM | POA: Diagnosis not present

## 2016-04-22 DIAGNOSIS — M170 Bilateral primary osteoarthritis of knee: Secondary | ICD-10-CM | POA: Diagnosis not present

## 2016-04-22 DIAGNOSIS — E139 Other specified diabetes mellitus without complications: Secondary | ICD-10-CM | POA: Diagnosis not present

## 2016-04-22 DIAGNOSIS — Z85850 Personal history of malignant neoplasm of thyroid: Secondary | ICD-10-CM | POA: Diagnosis not present

## 2016-04-22 DIAGNOSIS — Z940 Kidney transplant status: Secondary | ICD-10-CM | POA: Diagnosis not present

## 2016-04-22 DIAGNOSIS — I480 Paroxysmal atrial fibrillation: Secondary | ICD-10-CM | POA: Diagnosis not present

## 2016-04-22 DIAGNOSIS — I10 Essential (primary) hypertension: Secondary | ICD-10-CM | POA: Diagnosis not present

## 2016-04-22 DIAGNOSIS — R634 Abnormal weight loss: Secondary | ICD-10-CM | POA: Diagnosis not present

## 2016-04-26 ENCOUNTER — Other Ambulatory Visit: Payer: Self-pay | Admitting: Cardiovascular Disease

## 2016-04-28 ENCOUNTER — Encounter (INDEPENDENT_AMBULATORY_CARE_PROVIDER_SITE_OTHER): Payer: Self-pay

## 2016-04-28 ENCOUNTER — Ambulatory Visit (INDEPENDENT_AMBULATORY_CARE_PROVIDER_SITE_OTHER): Payer: Medicare Other | Admitting: *Deleted

## 2016-04-28 DIAGNOSIS — Z7901 Long term (current) use of anticoagulants: Secondary | ICD-10-CM

## 2016-04-28 DIAGNOSIS — Z5181 Encounter for therapeutic drug level monitoring: Secondary | ICD-10-CM

## 2016-04-28 DIAGNOSIS — I4891 Unspecified atrial fibrillation: Secondary | ICD-10-CM

## 2016-04-28 LAB — POCT INR: INR: 1.3

## 2016-05-05 ENCOUNTER — Ambulatory Visit (INDEPENDENT_AMBULATORY_CARE_PROVIDER_SITE_OTHER): Payer: Medicare Other | Admitting: *Deleted

## 2016-05-05 DIAGNOSIS — Z7901 Long term (current) use of anticoagulants: Secondary | ICD-10-CM | POA: Diagnosis not present

## 2016-05-05 DIAGNOSIS — I4891 Unspecified atrial fibrillation: Secondary | ICD-10-CM | POA: Diagnosis not present

## 2016-05-05 DIAGNOSIS — Z5181 Encounter for therapeutic drug level monitoring: Secondary | ICD-10-CM

## 2016-05-05 LAB — POCT INR: INR: 1.7

## 2016-05-06 ENCOUNTER — Encounter: Payer: Self-pay | Admitting: Gastroenterology

## 2016-05-12 ENCOUNTER — Ambulatory Visit (INDEPENDENT_AMBULATORY_CARE_PROVIDER_SITE_OTHER): Payer: Medicare Other | Admitting: *Deleted

## 2016-05-12 DIAGNOSIS — Z5181 Encounter for therapeutic drug level monitoring: Secondary | ICD-10-CM | POA: Diagnosis not present

## 2016-05-12 DIAGNOSIS — Z7901 Long term (current) use of anticoagulants: Secondary | ICD-10-CM

## 2016-05-12 DIAGNOSIS — I4891 Unspecified atrial fibrillation: Secondary | ICD-10-CM

## 2016-05-12 LAB — POCT INR: INR: 2.2

## 2016-05-24 DIAGNOSIS — Z6826 Body mass index (BMI) 26.0-26.9, adult: Secondary | ICD-10-CM | POA: Diagnosis not present

## 2016-05-24 DIAGNOSIS — Z940 Kidney transplant status: Secondary | ICD-10-CM | POA: Diagnosis not present

## 2016-05-24 DIAGNOSIS — I10 Essential (primary) hypertension: Secondary | ICD-10-CM | POA: Diagnosis not present

## 2016-05-24 DIAGNOSIS — R634 Abnormal weight loss: Secondary | ICD-10-CM | POA: Diagnosis not present

## 2016-05-24 DIAGNOSIS — E139 Other specified diabetes mellitus without complications: Secondary | ICD-10-CM | POA: Diagnosis not present

## 2016-05-24 DIAGNOSIS — E785 Hyperlipidemia, unspecified: Secondary | ICD-10-CM | POA: Diagnosis not present

## 2016-05-24 DIAGNOSIS — Z85850 Personal history of malignant neoplasm of thyroid: Secondary | ICD-10-CM | POA: Diagnosis not present

## 2016-05-24 DIAGNOSIS — I480 Paroxysmal atrial fibrillation: Secondary | ICD-10-CM | POA: Diagnosis not present

## 2016-05-24 DIAGNOSIS — N2581 Secondary hyperparathyroidism of renal origin: Secondary | ICD-10-CM | POA: Diagnosis not present

## 2016-05-24 DIAGNOSIS — M170 Bilateral primary osteoarthritis of knee: Secondary | ICD-10-CM | POA: Diagnosis not present

## 2016-05-26 ENCOUNTER — Ambulatory Visit (INDEPENDENT_AMBULATORY_CARE_PROVIDER_SITE_OTHER): Payer: Medicare Other | Admitting: *Deleted

## 2016-05-26 DIAGNOSIS — I4891 Unspecified atrial fibrillation: Secondary | ICD-10-CM | POA: Diagnosis not present

## 2016-05-26 DIAGNOSIS — Z5181 Encounter for therapeutic drug level monitoring: Secondary | ICD-10-CM

## 2016-05-26 DIAGNOSIS — Z7901 Long term (current) use of anticoagulants: Secondary | ICD-10-CM

## 2016-05-26 LAB — POCT INR: INR: 3.2

## 2016-06-09 ENCOUNTER — Ambulatory Visit (INDEPENDENT_AMBULATORY_CARE_PROVIDER_SITE_OTHER): Payer: Medicare Other | Admitting: Pharmacist

## 2016-06-09 DIAGNOSIS — Z7901 Long term (current) use of anticoagulants: Secondary | ICD-10-CM

## 2016-06-09 DIAGNOSIS — Z940 Kidney transplant status: Secondary | ICD-10-CM | POA: Diagnosis not present

## 2016-06-09 DIAGNOSIS — Z5181 Encounter for therapeutic drug level monitoring: Secondary | ICD-10-CM | POA: Diagnosis not present

## 2016-06-09 DIAGNOSIS — I4891 Unspecified atrial fibrillation: Secondary | ICD-10-CM

## 2016-06-09 LAB — POCT INR: INR: 1.8

## 2016-06-10 ENCOUNTER — Encounter (INDEPENDENT_AMBULATORY_CARE_PROVIDER_SITE_OTHER): Payer: Self-pay

## 2016-06-10 ENCOUNTER — Ambulatory Visit (INDEPENDENT_AMBULATORY_CARE_PROVIDER_SITE_OTHER): Payer: Medicare Other | Admitting: Gastroenterology

## 2016-06-10 ENCOUNTER — Encounter: Payer: Self-pay | Admitting: Gastroenterology

## 2016-06-10 VITALS — BP 142/60 | HR 70 | Ht 74.0 in | Wt 171.0 lb

## 2016-06-10 DIAGNOSIS — Z1211 Encounter for screening for malignant neoplasm of colon: Secondary | ICD-10-CM | POA: Diagnosis not present

## 2016-06-10 DIAGNOSIS — Z7901 Long term (current) use of anticoagulants: Secondary | ICD-10-CM | POA: Diagnosis not present

## 2016-06-10 MED ORDER — NA SULFATE-K SULFATE-MG SULF 17.5-3.13-1.6 GM/177ML PO SOLN: 1.0000 | Freq: Once | ORAL | 0 refills | Status: AC

## 2016-06-10 NOTE — Patient Instructions (Addendum)
If you are age 60 or older, your body mass index should be between 23-30. Your Body mass index is 21.96 kg/m. If this is out of the aforementioned range listed, please consider follow up with your Primary Care Provider.  If you are age 35 or younger, your body mass index should be between 19-25. Your Body mass index is 21.96 kg/m. If this is out of the aformentioned range listed, please consider follow up with your Primary Care Provider.   We have sent the following medications to your pharmacy for you to pick up at your convenience:  [place] have been scheduled for a colonoscopy. Please follow written instructions given to you at your visit today.  Please pick up your prep supplies at the pharmacy within the next 1-3 days. If you use inhalers (even only as needed), please bring them with you on the day of your procedure. Your physician has requested that you go to www.startemmi.com and enter the access code given to you at your visit today. This web site gives a general overview about your procedure. However, you should still follow specific instructions given to you by our office regarding your preparation for the procedure.  We will contact Dr. Johnsie Cancel in regards to your Coumadin. If you have not heard from our office in one week please call us.  Thank you.

## 2016-06-10 NOTE — Progress Notes (Signed)
HPI :  61 y/o male with a history of AF on coumadin, history of EF 55-60% in 2014, DM, history of renal transplant in 2004, here for new patient evaluation for colon cancer screening.   No prior colon cancer screening. No bowel habit troubles, no constipation or diarrhea. No blood in the stools. No abdominal pains. Eating well. No nausea or vomiting. He currently feels well without complaints  No prior colonoscopy. No FH of colon cancer   Labs from nephrology 04/22/16 Hgb 12.8, MCV 84, WBC 4.9, plt 249 Normal LFTs      Past Medical History:  Diagnosis Date  . Atrial fibrillation ([place])    dx 07/2012; CHADS2=2; coumadin started  . Chronic kidney disease    dialysis; kidney transplant  2004  . Dyslipidemia   . Ejection fraction    a. Echo 7/14:  Mod LVH, EF 55-60%, Gr 2 DD, MAC, mild LAE, PASP 32, trivial Eff.  . Ejection fraction    .  Marland Kitchen History of verruca vulgaris   . Hypercalcemia   . Hyperparathyroidism ([place])   . Hypertension   . Multiple thyroid nodules   . PAF (paroxysmal atrial fibrillation) ([place])   . 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 Preop cardiovascular exam    Preop clearance for thyroid surgery June, 2013  . Thyroid cancer ([place])   . Vitamin D deficiency   . Vitiligo      Past Surgical History:  Procedure Laterality Date  . KIDNEY TRANSPLANT  2004  . THYROIDECTOMY  08/17/2011   Procedure: THYROIDECTOMY;  Surgeon: Earnstine Regal, MD;  Location: [facility];  Service: General;  Laterality: N/A;   Family History  Problem Relation Age of Onset  . Stroke Father    Social History  Substance Use Topics  . Smoking status: Never Smoker  . Smokeless tobacco: Not on file  . Alcohol use No   Current Outpatient Prescriptions  Medication Sig Dispense Refill  . amLODipine (NORVASC) 10 MG tablet Take 10 mg by mouth 2 (two) times daily.    Marland Kitchen atorvastatin (LIPITOR) 80 MG tablet Take 80 mg by mouth daily.    . cloNIDine (CATAPRES) 0.3 MG tablet Take 0.3 mg by  mouth 2 (two) times daily.    . cycloSPORINE modified (NEORAL) 100 MG capsule Take 125 mg by mouth 2 (two) times daily.     . enalapril (VASOTEC) 5 MG tablet Take 5 mg by mouth at bedtime.     . furosemide (LASIX) 40 MG tablet Take 40 mg by mouth daily.    Marland Kitchen glipiZIDE (GLUCOTROL XL) 5 MG 24 hr tablet Take 5 mg by mouth daily.    Marland Kitchen levothyroxine (SYNTHROID, LEVOTHROID) 137 MCG tablet Take 137 mcg by mouth daily before breakfast.    . metoprolol succinate (TOPROL-XL) 50 MG 24 hr tablet Take 1 tablet (50 mg total) by mouth daily. Take with or immediately following a meal. 90 tablet 3  . mycophenolate (MYFORTIC) 360 MG TBEC Take 720 mg by mouth 2 (two) times daily.     . predniSONE (DELTASONE) 5 MG tablet Take 5 mg by mouth daily.    Marland Kitchen warfarin (COUMADIN) 5 MG tablet USE AS DIRECTED BY  COUMADIN  CLINIC 30 tablet 3   No current facility-administered medications for this visit.    No Known Allergies   Review of Systems: All systems reviewed and negative except where noted in HPI.    Labs as outlined above  Physical Exam: BP (!) 142/60  Pulse 70   Ht _0  (1.88 m)   Wt 171 lb (77.6 kg)   BMI 21.96 kg/m  Constitutional: Pleasant,well-developed, male in no acute distress. HEENT: Normocephalic and atraumatic. Conjunctivae are normal. No scleral icterus. Neck supple.  Cardiovascular: Normal rate, regular rhythm.  Pulmonary/chest: Effort normal and breath sounds normal. No wheezing, rales or rhonchi. Abdominal: Soft, protuberant, nontender. There are no masses palpable. No hepatomegaly. Extremities: no edema Lymphadenopathy: No cervical adenopathy noted. Neurological: Alert and oriented to person place and time. Skin: Skin is warm and dry. No rashes noted. Psychiatric: Normal mood and affect. Behavior is normal.   ASSESSMENT AND PLAN: 61 year old male with history as outlined above, including atrial fibrillation on Coumadin, here for first time colon cancer screening. He is overdue  for screening and is average risk for colon cancer.  We discussed screening options to include optical colonoscopy versus stool based testing. Following discussion of each, he wished to proceed with colonoscopy. I discussed risks benefits of colonoscopy and anesthesia he wished proceed. He will need to hold Coumadin for 5 days before the procedure, we will reach out to his prescribing physician to ensure this is okay.  [place] Isabella Cellar, MD [facility] Pager [phone]  CC: Jamal Maes, MD

## 2016-06-28 ENCOUNTER — Telehealth: Payer: Self-pay | Admitting: Gastroenterology

## 2016-06-28 NOTE — Telephone Encounter (Signed)
Patient notified

## 2016-06-28 NOTE — Telephone Encounter (Signed)
I have put a sample of Suprep up front for pt. Please call him and let him know. Thank you.

## 2016-06-30 ENCOUNTER — Ambulatory Visit (INDEPENDENT_AMBULATORY_CARE_PROVIDER_SITE_OTHER): Payer: Medicare Other | Admitting: *Deleted

## 2016-06-30 ENCOUNTER — Telehealth: Payer: Self-pay | Admitting: Pharmacist

## 2016-06-30 DIAGNOSIS — I4891 Unspecified atrial fibrillation: Secondary | ICD-10-CM

## 2016-06-30 DIAGNOSIS — Z5181 Encounter for therapeutic drug level monitoring: Secondary | ICD-10-CM

## 2016-06-30 LAB — POCT INR: INR: 2.6

## 2016-06-30 NOTE — Telephone Encounter (Signed)
Pt in office for INR check and reports that will have colonoscopy next Wednesday with Dr. Havery Moros. Pt on warfarin for Afib with CHADS <4 and no history of stroke. Per protocol ok to hold warfarin 5 days prior to procedure. Resume evening of procedure or as directed by MD.

## 2016-07-07 ENCOUNTER — Encounter: Payer: Self-pay | Admitting: Gastroenterology

## 2016-07-07 ENCOUNTER — Ambulatory Visit (AMBULATORY_SURGERY_CENTER): Payer: Medicare Other | Admitting: Gastroenterology

## 2016-07-07 VITALS — BP 143/73 | HR 64 | Temp 97.7°F | Resp 14 | Ht 74.0 in | Wt 171.0 lb

## 2016-07-07 DIAGNOSIS — D123 Benign neoplasm of transverse colon: Secondary | ICD-10-CM

## 2016-07-07 DIAGNOSIS — I4891 Unspecified atrial fibrillation: Secondary | ICD-10-CM | POA: Diagnosis not present

## 2016-07-07 DIAGNOSIS — Z1211 Encounter for screening for malignant neoplasm of colon: Secondary | ICD-10-CM

## 2016-07-07 DIAGNOSIS — K635 Polyp of colon: Secondary | ICD-10-CM

## 2016-07-07 DIAGNOSIS — Z1212 Encounter for screening for malignant neoplasm of rectum: Secondary | ICD-10-CM | POA: Diagnosis not present

## 2016-07-07 DIAGNOSIS — D120 Benign neoplasm of cecum: Secondary | ICD-10-CM

## 2016-07-07 MED ORDER — SODIUM CHLORIDE 0.9 % IV SOLN: 500.0000 mL | INTRAVENOUS | Status: DC

## 2016-07-07 NOTE — Op Note (Signed)
Randy [name] Patient Name: Randy [name] Date: 07/07/2016 3:57 PM MRN: [id number] Endoscopist: Randy Lipps P. Zaiya Annunziato MD, MD Age: 61 Referring MD:  Date of Birth: Mar 21, 1955 Gender: Male Account #: [id number] [name]:                Colonoscopy Indications:              Screening for colorectal malignant neoplasm, This                            is the patient's first colonoscopy Medicines:                Monitored Anesthesia Care [name]:                Pre-Anesthesia Assessment:                           - Prior to the [name], a History and Physical                            was performed, and patient medications and                            allergies were reviewed. The patient's tolerance of                            previous anesthesia was also reviewed. The risks                            and benefits of the [name] and the sedation                            options and risks were discussed with the patient.                            All questions were answered, and informed consent                            was obtained. Prior Anticoagulants: The patient has                            taken Coumadin (warfarin), last dose was 5 days                            prior to [name]. ASA Grade Assessment: II - A                            patient with mild systemic disease. After reviewing                            the risks and benefits, the patient was deemed in                            satisfactory condition to undergo the [name].  After obtaining informed consent, the colonoscope                            was passed under direct vision. Throughout the                            [name], the patient's blood pressure, pulse, and                            oxygen saturations were monitored continuously. The                            Colonoscope was introduced through the anus and                            advanced to  the the cecum, identified by                            appendiceal orifice and ileocecal valve. The                            colonoscopy was performed without difficulty. The                            quality of the bowel preparation was good. The                            ileocecal valve, appendiceal orifice, and rectum                            were photographed. Scope In: 4:09:07 PM Scope Out: 4:26:39 PM Scope Withdrawal Time: 0 hours 14 minutes 7 seconds  Total [name] Duration: 0 hours 17 minutes 32 seconds  Findings:                 The perianal and digital rectal examinations were                            normal.                           Two sessile polyps were found in the cecum. The                            polyps were 3 mm in size. These polyps were removed                            with a cold snare. Resection and retrieval were                            complete.                           Two sessile polyps were found in the transverse  colon. The polyps were 3 to 4 mm in size. These                            polyps were removed with a cold snare. Resection                            and retrieval were complete.                           A few small-mouthed diverticula were found in the                            transverse colon.                           Internal hemorrhoids were found during                            retroflexion. The hemorrhoids were small.                           The exam was otherwise without abnormality. Complications:            No immediate complications. Estimated blood loss:                            Minimal. Estimated Blood Loss:     Estimated blood loss was minimal. Impression:               - Two 3 mm polyps in the cecum, removed with a cold                            snare. Resected and retrieved.                           - Two 3 to 4 mm polyps in the transverse colon,                             removed with a cold snare. Resected and retrieved.                           - Diverticulosis in the transverse colon.                           - Internal hemorrhoids.                           - The examination was otherwise normal. Recommendation:           - Patient has a contact number available for                            emergencies. The signs and symptoms of potential                            delayed complications were  discussed with the                            patient. Return to normal activities tomorrow.                            Written discharge instructions were provided to the                            patient.                           - Resume previous diet.                           - Continue present medications.                           - Resume coumadin tomorrow                           - Await pathology results.                           - Repeat colonoscopy is recommended for                            surveillance. The colonoscopy date will be                            determined after pathology results from today's                            exam become available for review.                           - No ibuprofen, naproxen, or other non-steroidal                            anti-inflammatory drugs for 2 weeks after polyp                            removal. Randy Lipps P. Randy Doepke MD, MD 07/07/2016 4:32:17 PM This report has been signed electronically.

## 2016-07-07 NOTE — Progress Notes (Signed)
A/ox3 pleased with MAC, report to Celia RN 

## 2016-07-07 NOTE — Progress Notes (Signed)
Called to room to assist during endoscopic procedure.  Patient ID and intended procedure confirmed with present staff. Received instructions for my participation in the procedure from the performing physician.  

## 2016-07-07 NOTE — Patient Instructions (Signed)
Discharge instructions given. Handouts on polyps,diverticulosis and hemorrhoids. No ibuprofen, naproxen, or other NSAIDs for two weeks. Resume previous medications. YOU HAD AN ENDOSCOPIC PROCEDURE TODAY AT THE [facility]:   Refer to the procedure report that was given to you for any specific questions about what was found during the examination.  If the procedure report does not answer your questions, please call your gastroenterologist to clarify.  If you requested that your care partner not be given the details of your procedure findings, then the procedure report has been included in a sealed envelope for you to review at your convenience later.  YOU SHOULD EXPECT: Some feelings of bloating in the abdomen. Passage of more gas than usual.  Walking can help get rid of the air that was put into your GI tract during the procedure and reduce the bloating. If you had a lower endoscopy (such as a colonoscopy or flexible sigmoidoscopy) you may notice spotting of blood in your stool or on the toilet paper. If you underwent a bowel prep for your procedure, you may not have a normal bowel movement for a few days.  Please Note:  You might notice some irritation and congestion in your nose or some drainage.  This is from the oxygen used during your procedure.  There is no need for concern and it should clear up in a day or so.  SYMPTOMS TO REPORT IMMEDIATELY:   Following lower endoscopy (colonoscopy or flexible sigmoidoscopy):  Excessive amounts of blood in the stool  Significant tenderness or worsening of abdominal pains  Swelling of the abdomen that is new, acute  Fever of 100F or higher   For urgent or emergent issues, a gastroenterologist can be reached at any hour by calling [phone].   DIET:  We do recommend a small meal at first, but then you may proceed to your regular diet.  Drink plenty of fluids but you should avoid alcoholic beverages for 24 hours.  ACTIVITY:  You  should plan to take it easy for the rest of today and you should NOT DRIVE or use heavy machinery until tomorrow (because of the sedation medicines used during the test).    FOLLOW UP: Our staff will call the number listed on your records the next business day following your procedure to check on you and address any questions or concerns that you may have regarding the information given to you following your procedure. If we do not reach you, we will leave a message.  However, if you are feeling well and you are not experiencing any problems, there is no need to return our call.  We will assume that you have returned to your regular daily activities without incident.  If any biopsies were taken you will be contacted by phone or by letter within the next 1-3 weeks.  Please call us at [phone] if you have not heard about the biopsies in 3 weeks.    SIGNATURES/CONFIDENTIALITY: You and/or your care partner have signed paperwork which will be entered into your electronic medical record.  These signatures attest to the fact that that the information above on your After Visit Summary has been reviewed and is understood.  Full responsibility of the confidentiality of this discharge information lies with you and/or your care-partner. 

## 2016-07-07 NOTE — Progress Notes (Signed)
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

## 2016-07-08 ENCOUNTER — Telehealth: Payer: Self-pay | Admitting: *Deleted

## 2016-07-08 NOTE — Telephone Encounter (Signed)
  Follow up Call-  Call back number 07/07/2016  Post procedure Call Back phone  # [phone]  Permission to leave phone message Yes  Some recent data might be hidden     Patient questions:  Do you have a fever, pain , or abdominal swelling? No. Pain Score  0 *  Have you tolerated food without any problems? Yes.    Have you been able to return to your normal activities? Yes.    Do you have any questions about your discharge instructions: Diet   No. Medications  No. Follow up visit  No.  Do you have questions or concerns about your Care? No.  Actions: * If pain score is 4 or above: No action needed, pain <4.

## 2016-07-14 ENCOUNTER — Encounter: Payer: Self-pay | Admitting: Gastroenterology

## 2016-07-15 ENCOUNTER — Ambulatory Visit (INDEPENDENT_AMBULATORY_CARE_PROVIDER_SITE_OTHER): Payer: Medicare Other | Admitting: *Deleted

## 2016-07-15 DIAGNOSIS — I4891 Unspecified atrial fibrillation: Secondary | ICD-10-CM | POA: Diagnosis not present

## 2016-07-15 DIAGNOSIS — Z5181 Encounter for therapeutic drug level monitoring: Secondary | ICD-10-CM

## 2016-07-15 DIAGNOSIS — Z940 Kidney transplant status: Secondary | ICD-10-CM | POA: Diagnosis not present

## 2016-07-15 LAB — POCT INR: INR: 1.1

## 2016-07-21 ENCOUNTER — Ambulatory Visit (INDEPENDENT_AMBULATORY_CARE_PROVIDER_SITE_OTHER): Payer: Medicare Other | Admitting: *Deleted

## 2016-07-21 DIAGNOSIS — Z5181 Encounter for therapeutic drug level monitoring: Secondary | ICD-10-CM

## 2016-07-21 DIAGNOSIS — I4891 Unspecified atrial fibrillation: Secondary | ICD-10-CM

## 2016-07-21 LAB — POCT INR: INR: 1.7

## 2016-08-04 ENCOUNTER — Ambulatory Visit (INDEPENDENT_AMBULATORY_CARE_PROVIDER_SITE_OTHER): Payer: Medicare Other | Admitting: *Deleted

## 2016-08-04 DIAGNOSIS — I4891 Unspecified atrial fibrillation: Secondary | ICD-10-CM | POA: Diagnosis not present

## 2016-08-04 DIAGNOSIS — Z5181 Encounter for therapeutic drug level monitoring: Secondary | ICD-10-CM | POA: Diagnosis not present

## 2016-08-04 LAB — POCT INR: INR: 3.4

## 2016-08-18 ENCOUNTER — Ambulatory Visit (INDEPENDENT_AMBULATORY_CARE_PROVIDER_SITE_OTHER): Payer: Medicare Other | Admitting: *Deleted

## 2016-08-18 DIAGNOSIS — I4891 Unspecified atrial fibrillation: Secondary | ICD-10-CM

## 2016-08-18 DIAGNOSIS — Z5181 Encounter for therapeutic drug level monitoring: Secondary | ICD-10-CM

## 2016-08-18 LAB — POCT INR: INR: 1.5

## 2016-09-01 ENCOUNTER — Ambulatory Visit (INDEPENDENT_AMBULATORY_CARE_PROVIDER_SITE_OTHER): Payer: Medicare Other | Admitting: Pharmacist

## 2016-09-01 DIAGNOSIS — I4891 Unspecified atrial fibrillation: Secondary | ICD-10-CM

## 2016-09-01 DIAGNOSIS — Z5181 Encounter for therapeutic drug level monitoring: Secondary | ICD-10-CM | POA: Diagnosis not present

## 2016-09-01 LAB — POCT INR: INR: 2

## 2016-09-09 DIAGNOSIS — Z6826 Body mass index (BMI) 26.0-26.9, adult: Secondary | ICD-10-CM | POA: Diagnosis not present

## 2016-09-09 DIAGNOSIS — Z940 Kidney transplant status: Secondary | ICD-10-CM | POA: Diagnosis not present

## 2016-09-09 DIAGNOSIS — R634 Abnormal weight loss: Secondary | ICD-10-CM | POA: Diagnosis not present

## 2016-09-09 DIAGNOSIS — E785 Hyperlipidemia, unspecified: Secondary | ICD-10-CM | POA: Diagnosis not present

## 2016-09-09 DIAGNOSIS — N2581 Secondary hyperparathyroidism of renal origin: Secondary | ICD-10-CM | POA: Diagnosis not present

## 2016-09-09 DIAGNOSIS — R251 Tremor, unspecified: Secondary | ICD-10-CM | POA: Diagnosis not present

## 2016-09-09 DIAGNOSIS — Z0000 Encounter for general adult medical examination without abnormal findings: Secondary | ICD-10-CM | POA: Diagnosis not present

## 2016-09-09 DIAGNOSIS — E139 Other specified diabetes mellitus without complications: Secondary | ICD-10-CM | POA: Diagnosis not present

## 2016-09-09 DIAGNOSIS — R972 Elevated prostate specific antigen [PSA]: Secondary | ICD-10-CM | POA: Diagnosis not present

## 2016-09-09 DIAGNOSIS — M170 Bilateral primary osteoarthritis of knee: Secondary | ICD-10-CM | POA: Diagnosis not present

## 2016-09-09 DIAGNOSIS — I10 Essential (primary) hypertension: Secondary | ICD-10-CM | POA: Diagnosis not present

## 2016-09-09 DIAGNOSIS — I480 Paroxysmal atrial fibrillation: Secondary | ICD-10-CM | POA: Diagnosis not present

## 2016-09-09 DIAGNOSIS — Z85850 Personal history of malignant neoplasm of thyroid: Secondary | ICD-10-CM | POA: Diagnosis not present

## 2016-09-10 ENCOUNTER — Other Ambulatory Visit: Payer: Self-pay | Admitting: Cardiovascular Disease

## 2016-09-16 ENCOUNTER — Other Ambulatory Visit: Payer: Self-pay | Admitting: Nephrology

## 2016-09-16 DIAGNOSIS — R251 Tremor, unspecified: Secondary | ICD-10-CM

## 2016-09-23 ENCOUNTER — Other Ambulatory Visit: Payer: Self-pay | Admitting: Nephrology

## 2016-09-23 ENCOUNTER — Ambulatory Visit
Admission: RE | Admit: 2016-09-23 | Discharge: 2016-09-23 | Disposition: A | Discharge status: Home / Self Care | Payer: Medicare Other | Source: Ambulatory Visit | Attending: Nephrology | Admitting: Nephrology

## 2016-09-24 ENCOUNTER — Other Ambulatory Visit: Payer: Self-pay | Admitting: Nephrology

## 2016-09-24 DIAGNOSIS — R9389 Abnormal findings on diagnostic imaging of other specified body structures: Secondary | ICD-10-CM

## 2016-09-29 ENCOUNTER — Ambulatory Visit
Admission: RE | Admit: 2016-09-29 | Discharge: 2016-09-29 | Disposition: A | Discharge status: Home / Self Care | Payer: Medicare Other | Source: Ambulatory Visit | Attending: Nephrology | Admitting: Nephrology

## 2016-09-29 DIAGNOSIS — I700 Atherosclerosis of aorta: Secondary | ICD-10-CM | POA: Diagnosis not present

## 2016-09-29 DIAGNOSIS — R9389 Abnormal findings on diagnostic imaging of other specified body structures: Secondary | ICD-10-CM

## 2016-09-29 IMAGING — CT CT CHEST W/O CM
2 of 4 series · 15 of 36 positions shown, 18 images · non-contrast
Comparison: Chest radiograph [DATE]

CLINICAL DATA: Questionable nodular opacity on chest radiograph.
History of thyroid carcinoma

EXAM:
CT CHEST WITHOUT CONTRAST
TECHNIQUE: Multidetector CT imaging of the chest was performed following the
standard protocol without IV contrast.

[Series 2: chest w/(date) · axial · 0.72mm/px · z∈[+952,+1260]mm · 12 of 178 slices shown, 15 images]
[im 12/178  mediastinal]
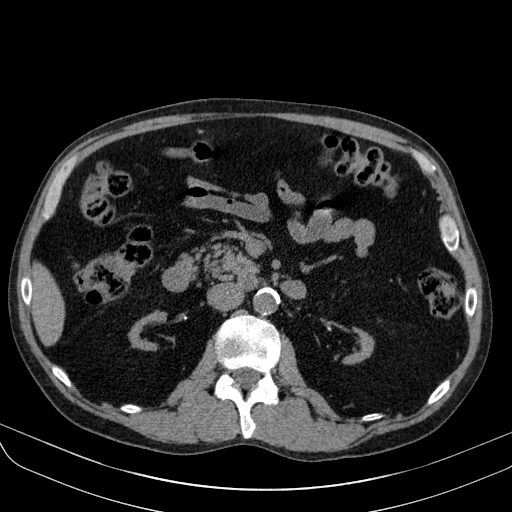
[im 12/178  lung]
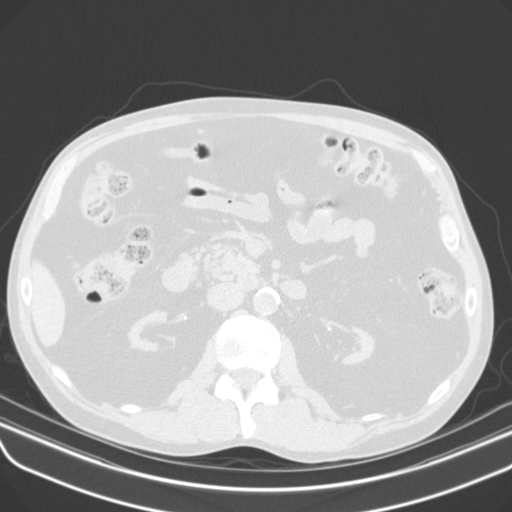
[im 24/178  lung]
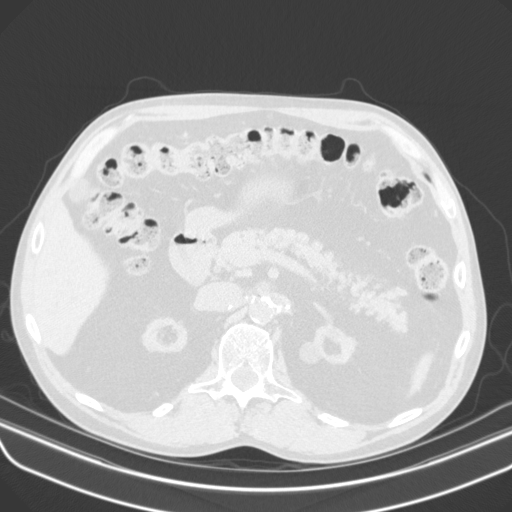
[im 36/178  lung]
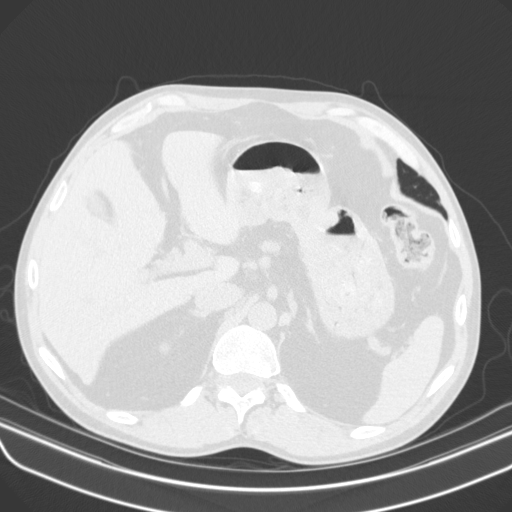
[im 60/178  lung]
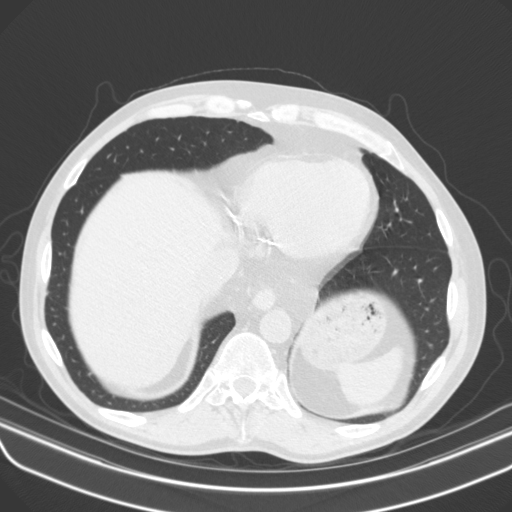
[im 71/178  mediastinal]
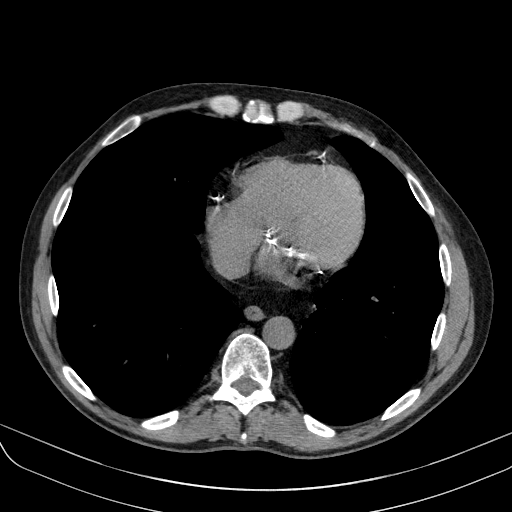
[im 71/178  lung]
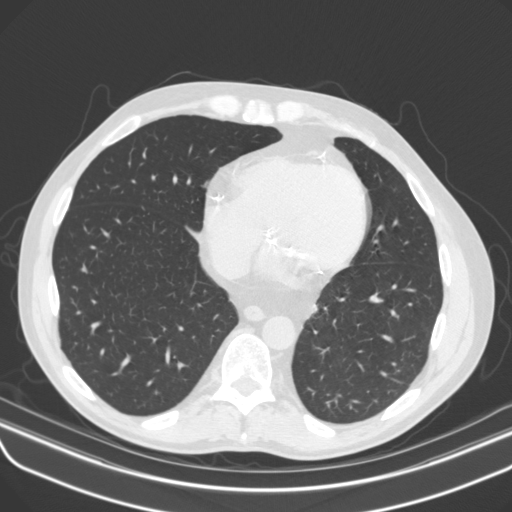
[im 83/178  lung]
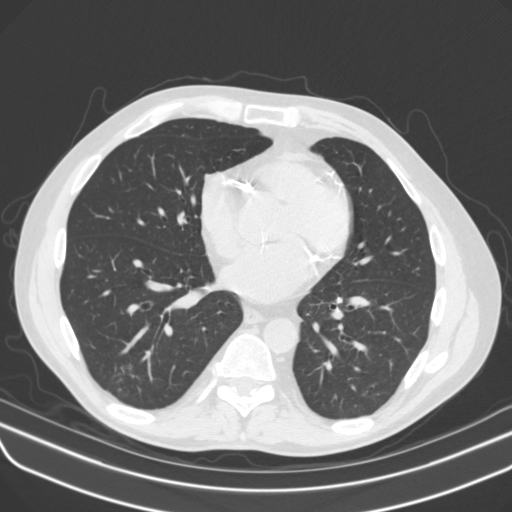
[im 95/178  lung]
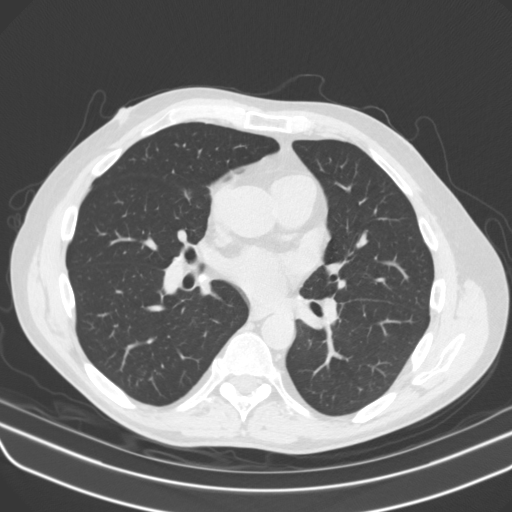
[im 107/178  lung]
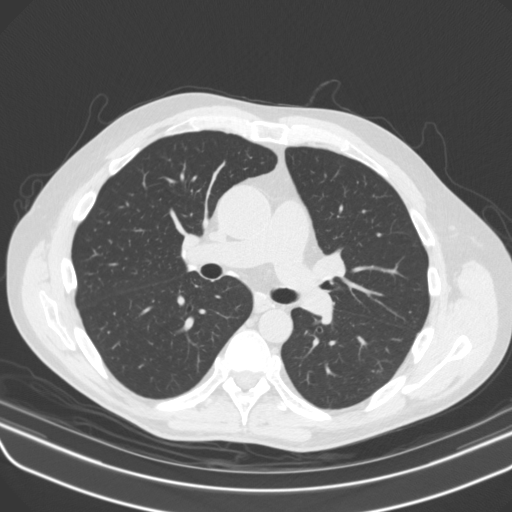
[im 119/178  mediastinal]
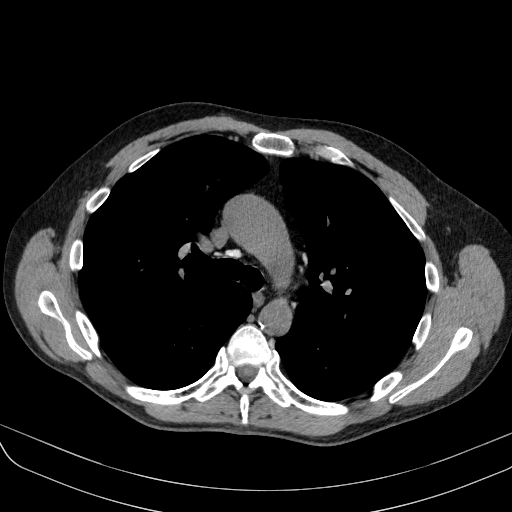
[im 119/178  lung]
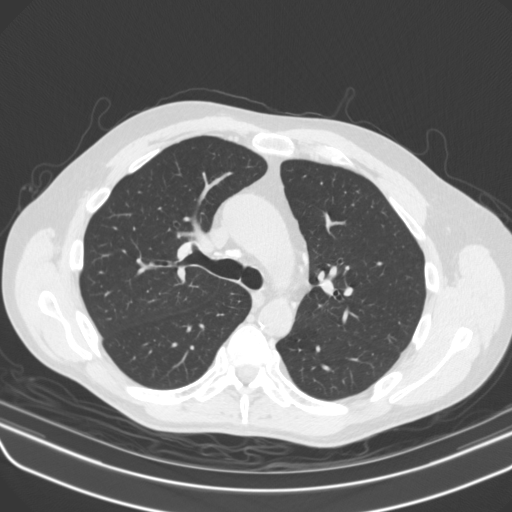
[im 142/178  lung]
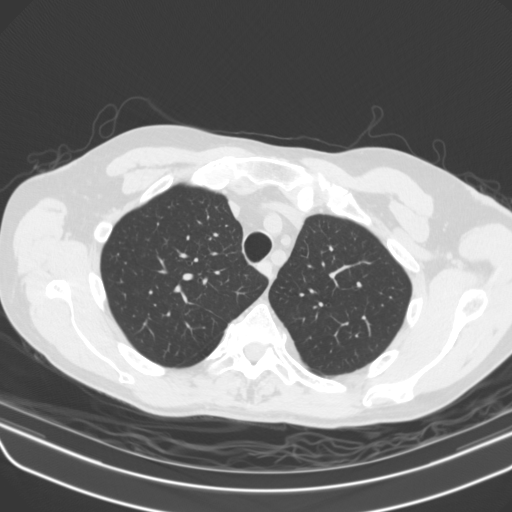
[im 154/178  lung]
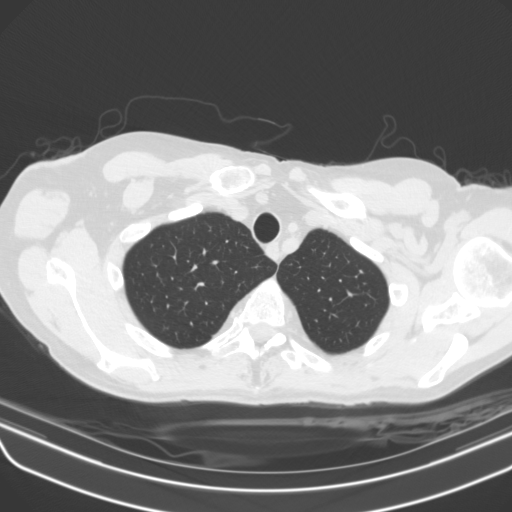
[im 166/178  lung]
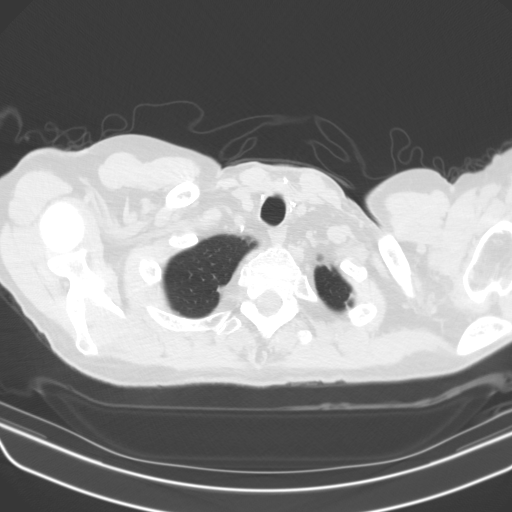

[Series 3: cor · coronal · 0.70mm/px · 3 of 65 slices shown]
[im 13/65  lung]
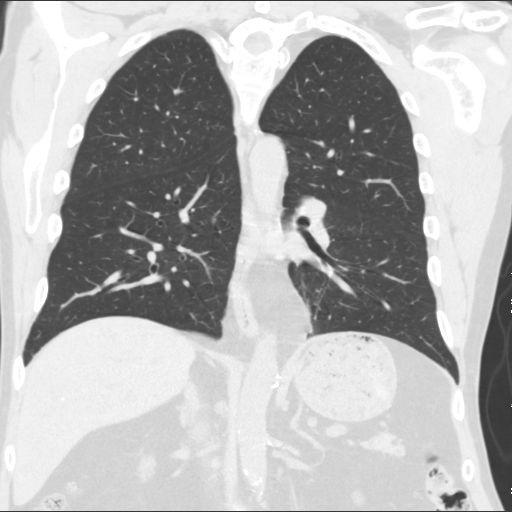
[im 26/65  lung]
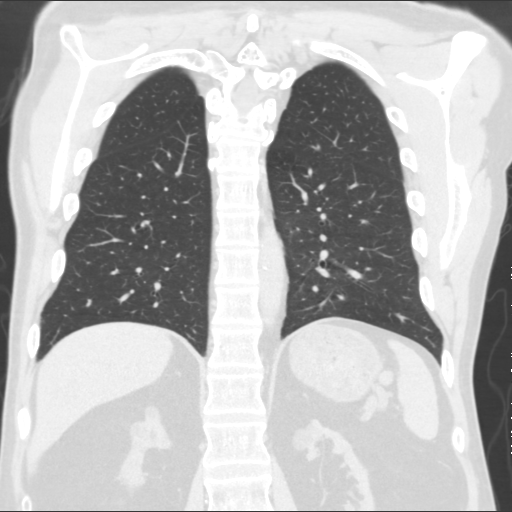
[im 39/65  lung]
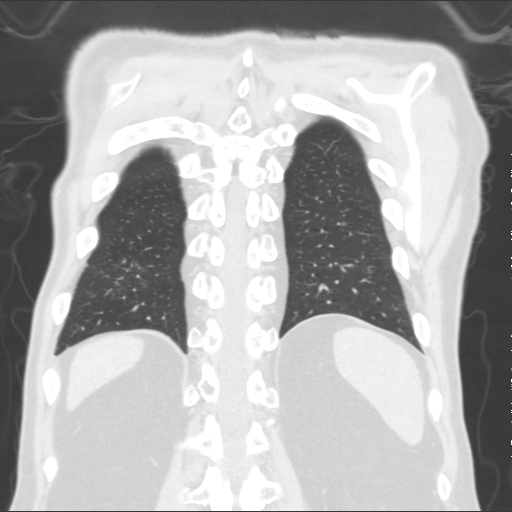

[15 of 36 positions shown; findings below may reference images not displayed]

FINDINGS: Cardiovascular: There is no appreciable thoracic aortic aneurysm.
There is calcification in the proximal great vessels. No tooth right
and left common carotid arteries arise as a common trunk, an
anatomic variant. There are foci of vascular calcification in the
aorta. There are multiple foci of coronary artery calcification.
Pericardium is not appreciably thickened.

Mediastinum/Nodes: Patient has had previous thyroidectomy. There is
no appreciable thoracic adenopathy. Subcentimeter mediastinal lymph
nodes show calcification, likely due to prior granulomatous disease.

Lungs/Pleura: There is mild scarring in the superior segment of the
right lower lobe. No parenchymal nodular lesions are appreciable on
this study. There is no edema or consolidation. No pleural effusion
or pleural thickening evident.

Upper Abdomen: There is atherosclerotic calcification in the
abdominal aorta. Native kidneys are markedly atrophic. There is a
cyst in the medial upper left kidney measuring 1.3 x 1.3 cm. A cyst
arising from the lower pole of the left kidney measures 2.1 x
cm. No hydronephrosis in the native kidneys. There is marked
calcification in the native renal arteries bilaterally.

Musculoskeletal: There are no blastic or lytic bone lesions.
IMPRESSION: 1. There is no nodular opacity in the right upper lobe to correlate
with the area question on recent chest radiograph. Etiology for this
opacity on chest radiograph is uncertain.

2. Mild scarring superior segment right lower lobe. No edema or
consolidation.

3. No appreciable adenopathy. Subcentimeter mediastinal lymph nodes
contain foci calcification, likely indicative of prior granulomatous
disease.

4.  Previous thyroidectomy.

5. Extensive coronary artery calcification. There is aortic
atherosclerosis. Calcification is noted in native renal arteries
bilaterally.

6.  Atrophic native kidneys.

Aortic Atherosclerosis ([OI]-[OI]).

## 2016-10-02 ENCOUNTER — Ambulatory Visit
Admission: RE | Admit: 2016-10-02 | Discharge: 2016-10-02 | Disposition: A | Discharge status: Home / Self Care | Payer: Medicare Other | Source: Ambulatory Visit | Attending: Nephrology | Admitting: Nephrology

## 2016-10-02 DIAGNOSIS — C73 Malignant neoplasm of thyroid gland: Secondary | ICD-10-CM | POA: Diagnosis not present

## 2016-10-02 DIAGNOSIS — R251 Tremor, unspecified: Secondary | ICD-10-CM | POA: Diagnosis not present

## 2016-10-05 DIAGNOSIS — R351 Nocturia: Secondary | ICD-10-CM | POA: Diagnosis not present

## 2016-11-04 ENCOUNTER — Encounter: Payer: Self-pay | Admitting: Neurology

## 2016-11-04 ENCOUNTER — Ambulatory Visit (INDEPENDENT_AMBULATORY_CARE_PROVIDER_SITE_OTHER): Payer: Medicare Other | Admitting: Neurology

## 2016-11-04 VITALS — BP 133/81 | HR 87 | Ht 74.0 in | Wt 167.2 lb

## 2016-11-04 DIAGNOSIS — R269 Unspecified abnormalities of gait and mobility: Secondary | ICD-10-CM | POA: Diagnosis not present

## 2016-11-04 DIAGNOSIS — G20 Parkinson's disease: Secondary | ICD-10-CM | POA: Insufficient documentation

## 2016-11-04 DIAGNOSIS — G3281 Cerebellar ataxia in diseases classified elsewhere: Secondary | ICD-10-CM

## 2016-11-04 MED ORDER — CARBIDOPA-LEVODOPA 25-100 MG PO TABS: 1.0000 | ORAL_TABLET | Freq: Three times a day (TID) | ORAL | 6 refills | Status: DC

## 2016-11-04 NOTE — Progress Notes (Signed)
PATIENT: Randy [name] DOB: 06-01-1955  Chief Complaint  Patient presents with  . Tremors    He is here with his sister, Randy [name]. Reports full body tremors that have progressively become worse over the last 3-4 months.  Symptoms worsen when he is cold or hungry.  . Nephrology    Randy Maes, MD - referring MD     HISTORICAL  Randy [name] is a 61 year old male, accompanied by his sister Randy [name], seen in refer by  his primary care doctor Randy [name] for evaluation of tremor, initial evaluation November 04 2016.  I reviewed and summarized referring note, he had a history of diabetes, atrial fibrillation, on chronic Coumadin treatment, kidney transplant on November 09 2002,  thyroidectomy, on supplement, hypertension, hyperlipidemia,  He noted gradual onset tremor since 2017, initially affecting left foot, gradually getting worse, now involving both arm, he also noticed mild unsteady gait, stooped forward, tends to lose his balance, decreased arm swing,  He become less active, complains of weight loss over the past 1 year, he denied loss sense of smell, he sleeps well, no REM sleep disorder, no constipation  There is no family history of termore.  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  REVIEW OF SYSTEMS: Full 14 system review of systems performed and notable only for tremor, decreased energy, joint pain, achy muscles, blurry vision, weight loss, fatigue,  swelling legs  ALLERGIES: No Known Allergies  HOME MEDICATIONS: Current Outpatient Prescriptions  Medication Sig Dispense Refill  . amLODipine (NORVASC) 10 MG tablet Take 10 mg by mouth 2 (two) times daily.    Marland Kitchen atorvastatin (LIPITOR) 80 MG tablet Take 80 mg by mouth daily.    . cloNIDine (CATAPRES) 0.3 MG tablet Take 0.3 mg by mouth 2 (two) times daily.    . cycloSPORINE modified (NEORAL) 100 MG capsule Take 125 mg by mouth 2 (two) times daily.     . enalapril (VASOTEC) 5 MG tablet Take 5 mg by mouth at bedtime.     . furosemide (LASIX) 40 MG tablet Take 40 mg by mouth daily.    Marland Kitchen glipiZIDE (GLUCOTROL XL) 5 MG 24 hr tablet Take 5 mg by mouth daily.    Marland Kitchen levothyroxine (SYNTHROID, LEVOTHROID) 137 MCG tablet Take 137 mcg by mouth daily before breakfast.    . metoprolol succinate (TOPROL-XL) 50 MG 24 hr tablet Take 1 tablet (50 mg total) by mouth daily. Take with or immediately following a meal. 90 tablet 3  . mycophenolate (MYFORTIC) 360 MG TBEC Take 720 mg by mouth 2 (two) times daily.     . predniSONE (DELTASONE) 5 MG tablet Take 5 mg by mouth daily.    Marland Kitchen warfarin (COUMADIN) 5 MG tablet USE AS DIRECTED BY COUMADIN CLINIC 30 tablet 3   Current Facility-Administered Medications  Medication Dose Route Frequency Provider Last Rate Last Dose  . 0.9 %  sodium chloride infusion  500 mL Intravenous Continuous Armbruster, Carlota Raspberry, MD        PAST MEDICAL HISTORY: Past Medical History:  Diagnosis Date  . Arthritis    knees  . Atrial fibrillation ([place])    dx 07/2012; CHADS2=2; coumadin started  . Chronic kidney disease    dialysis; kidney transplant  2004  . Diabetes ([place])   . Dyslipidemia   . Ejection fraction    a. Echo 7/14:  Mod LVH, EF 55-60%, Gr 2 DD, MAC, mild LAE, PASP 32, trivial Eff.  . Ejection fraction    .  Marland Kitchen History of verruca vulgaris   . Hypercalcemia   . Hyperlipidemia   . Hyperparathyroidism ([place])   . Hypertension   . Multiple thyroid nodules   . PAF (paroxysmal  atrial fibrillation) ([place])   . 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 Preop cardiovascular exam    Preop clearance for thyroid surgery June, 2013  . Thyroid cancer ([place])   . Vitamin D deficiency   . Vitiligo     PAST SURGICAL HISTORY: Past Surgical History:  Procedure Laterality Date  . KIDNEY TRANSPLANT  2004  . THYROIDECTOMY  08/17/2011   Procedure: THYROIDECTOMY;  Surgeon: Earnstine Regal, MD;  Location: [facility];  Service: General;  Laterality: N/A;    FAMILY HISTORY: Family History  Problem Relation Age of Onset  . Dementia Mother   . Diabetes Mother   . Stroke Father   . Diabetes Father     SOCIAL HISTORY:  Social History   Social History  . Marital status: Single    Spouse name: N/A  . Number of children: 2  . Years of education: HS   Occupational History  . Disabled    Social History Main Topics  . Smoking status: Never Smoker  . Smokeless tobacco: Never Used  . Alcohol use No     Comment: no use since 2013  . Drug use: No     Comment: previous use - not since 1993  . Sexual activity: Not on file   Other Topics Concern  . Not on file   Social History Narrative   Lives at home alone.   Right-handed.   12 ounces of caffeine weekly.     PHYSICAL EXAM   Vitals:   11/04/16 1029  BP: 133/81  Pulse: 87  Weight: 167 lb 4 oz (75.9 kg)  Height: _0  (1.88 m)    Not recorded      Body mass index is 21.47 kg/m.  PHYSICAL EXAMNIATION:  Gen: NAD, conversant, well nourised, obese, well groomed                     Cardiovascular: Regular rate rhythm, no peripheral edema, warm, nontender. Eyes: Conjunctivae clear without exudates or hemorrhage Neck: Supple, no carotid bruits. Pulmonary: Clear to auscultation bilaterally   NEUROLOGICAL EXAM:  MENTAL STATUS: Speech:    Speech is normal; fluent and spontaneous with normal comprehension.  Cognition:     Orientation to time, place and person     Normal recent and remote memory     Normal  Attention span and concentration     Normal Language, naming, repeating,spontaneous speech     Fund of knowledge   CRANIAL NERVES: CN II: Visual fields are full to confrontation. Fundoscopic exam is normal with sharp discs and no vascular changes. Pupils are round equal and briskly reactive to light. CN III, IV, VI: extraocular movement are normal. No ptosis. CN V: Facial sensation is intact to pinprick in all 3 divisions bilaterally. Corneal responses are intact.  CN VII: Face is symmetric with  normal eye closure and smile. CN VIII: Hearing is normal to rubbing fingers CN IX, X: Palate elevates symmetrically. Phonation is normal. CN XI: Head turning and shoulder shrug are intact CN XII: Tongue is midline with normal movements and no atrophy.  MOTOR: Left arm patent AVM, there was mild right more than left arm rigidity, increased with reinforcement maneuver, bradykinesia, no significant weakness, mild left foot, bilateral upper extremity resting tremor  REFLEXES: Reflexes are 3 and symmetric at the biceps, triceps, knees, and ankles. Plantar responses are extensor bilaterally.  SENSORY: Intact to light touch, pinprick, positional sensation and vibratory sensation are intact in fingers and toes.  COORDINATION: Rapid alternating movements and fine finger movements are intact. There is no dysmetria on finger-to-nose and heel-knee-shin.    GAIT/STANCE: He needs push up to get up from seated position, stoop forward, mildly unsteady, cautious, decreased bilateral arm swing, En block turning  DIAGNOSTIC DATA (LABS, IMAGING, TESTING) - I reviewed patient records, labs, notes, testing and imaging myself where available.   ASSESSMENT AND PLAN  Joram Venson is a 61 y.o. male   Parkinsonian symptoms  DatScan  Try Sinemet 25/100 tid  Refer to physical therapy Early onset gait abnormality  Hyperreflexia on examinations, MRI of cervical spine to rule out cervical spondylitic  myelopathy  Marcial Pacas, M.D. Ph.D.  [facility] [address], [place], [place] [address] Ph: [phone] Fax: [phone]  CC: Randy Maes, MD

## 2016-11-17 ENCOUNTER — Ambulatory Visit (HOSPITAL_COMMUNITY): Payer: Medicare Other

## 2016-11-17 ENCOUNTER — Other Ambulatory Visit (HOSPITAL_COMMUNITY): Payer: Medicare Other

## 2016-11-18 ENCOUNTER — Encounter (HOSPITAL_COMMUNITY): Payer: Medicare Other

## 2016-11-19 ENCOUNTER — Telehealth: Payer: Self-pay | Admitting: Neurology

## 2016-11-19 ENCOUNTER — Ambulatory Visit
Admission: RE | Admit: 2016-11-19 | Discharge: 2016-11-19 | Disposition: A | Discharge status: Home / Self Care | Payer: Self-pay | Source: Ambulatory Visit | Attending: Neurology | Admitting: Neurology

## 2016-11-19 DIAGNOSIS — M50223 Other cervical disc displacement at C6-C7 level: Secondary | ICD-10-CM | POA: Diagnosis not present

## 2016-11-19 DIAGNOSIS — G3281 Cerebellar ataxia in diseases classified elsewhere: Secondary | ICD-10-CM

## 2016-11-19 NOTE — Telephone Encounter (Signed)
Please call patient MRI of cervical spine showed evidence of degenerative changes but there was no evidence of spinal cord or spinal nerve compression  IMPRESSION: Abnormal MRI scan of cervical spine showing prominent spondylitic change at C5-6 with endplate degenerative changes resulting in bilateral moderate foraminal narrowing

## 2016-11-22 NOTE — Telephone Encounter (Signed)
Spoke with patient MRI of cervical spine showed evidence of degenerative changes, but there was no evidence of spinal cord or spinal nerve compression. Patient verbalized understanding, had no questions.

## 2016-11-23 ENCOUNTER — Ambulatory Visit: Payer: Medicare Other | Attending: Neurology | Admitting: Physical Therapy

## 2016-11-23 DIAGNOSIS — R2681 Unsteadiness on feet: Secondary | ICD-10-CM | POA: Diagnosis not present

## 2016-11-23 DIAGNOSIS — R29818 Other symptoms and signs involving the nervous system: Secondary | ICD-10-CM | POA: Insufficient documentation

## 2016-11-23 DIAGNOSIS — R293 Abnormal posture: Secondary | ICD-10-CM | POA: Diagnosis not present

## 2016-11-23 DIAGNOSIS — R2689 Other abnormalities of gait and mobility: Secondary | ICD-10-CM | POA: Insufficient documentation

## 2016-11-23 NOTE — Therapy (Signed)
[facility] [address] [place], [place], [address] Phone: [phone]   Fax:  [phone]  Physical Therapy Evaluation  Patient Details  Name: Randy [name] MRN: [id number] Date of Birth: 1955/10/15 Referring Provider: Krista Blue  Encounter Date: 11/23/2016      PT End of Session - 11/23/16 2016    Visit Number 1   Number of Visits 16   Date for PT Re-Evaluation 01/22/17   Authorization Type Medicare-GCODE every 10th visit   PT Start Time 1015   PT Stop Time 1100   PT Time Calculation (min) 45 min   Activity Tolerance Patient tolerated treatment well   Behavior During Therapy [facility] for tasks assessed/performed      Past Medical History:  Diagnosis Date  . Arthritis    knees  . Atrial fibrillation ([place])    dx 07/2012; CHADS2=2; coumadin started  . Chronic kidney disease    dialysis; kidney transplant  2004  . Diabetes ([place])   . Dyslipidemia   . Ejection fraction    a. Echo 7/14:  Mod LVH, EF 55-60%, Gr 2 DD, MAC, mild LAE, PASP 32, trivial Eff.  . Ejection fraction    .  Marland Kitchen History of verruca vulgaris   . Hypercalcemia   . Hyperlipidemia   . Hyperparathyroidism ([place])   . Hypertension   . Multiple thyroid nodules   . PAF (paroxysmal atrial fibrillation) ([place])   . 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 Preop cardiovascular exam    Preop clearance for thyroid surgery June, 2013  . Thyroid cancer ([place])   . Vitamin D deficiency   . Vitiligo     Past Surgical History:  Procedure Laterality Date  . KIDNEY TRANSPLANT  2004  . THYROIDECTOMY  08/17/2011   Procedure: THYROIDECTOMY;  Surgeon: Earnstine Regal, MD;  Location: [facility];  Service: General;  Laterality: N/A;    There were no vitals filed for this visit.       Subjective Assessment - 11/23/16 1018    Subjective Pt reports he's not as strong as he was, and he feels legs are not too strong.  He feels his balance is off.  Feels like this has been  going on over the past 9 months.  Notes tremors at times, which started in L foot.  With addition of Sinemet, pt reports feeling stronger and his appetite has picked up.  No falls in past 6 months.   Pertinent History DM, A-fib, kidney transplant 2004, HTN   Patient Stated Goals Pt's goals for therapy are to improve strength in legs.   Currently in Pain? No/denies            [facility] PT Assessment - 11/23/16 1021      Assessment   Medical Diagnosis Parkinsonism   Referring Provider Krista Blue   Onset Date/Surgical Date 11/04/16     Precautions   Precautions Fall     Balance Screen   Has the patient fallen in the past 6 months No   Has the patient had a decrease in activity level because of a fear of falling?  No   Is the patient reluctant to leave their home because of a fear of falling?  No     Home Environment   Living Environment Private residence   Living Arrangements Alone  May move in with sister   Available Help at Discharge Family   Type of [place] to enter  Entrance Stairs-Number of Steps 7   Entrance Stairs-Rails Right;Left;Cannot reach both   Home Layout One level   [employer] - single point;Walker - 4 wheels;Walker - 2 wheels     Prior Function   Level of Independence Independent with basic ADLs;Independent with household mobility without device;Independent with community mobility without device  Sister and daughter cook for patient (new in past yr)   Vocation On disability  kidney transplant-2004   Leisure Enjoys yard work-did yard work as side work; enjoys fishing, 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     Observation/Other Assessments   Focus on Therapeutic Outcomes (FOTO)  NA     Posture/Postural Control   Posture/Postural Control Postural limitations   Postural Limitations Forward head     Tone   Assessment Location Right Lower Extremity;Left Lower Extremity     ROM / Strength   AROM /  PROM / Strength Strength     Strength   Overall Strength Comments Grossly tested at least 4/5 bilateral lower extremities     Transfers   Transfers Sit to Stand;Stand to Sit   Sit to Stand 6: Modified independent (Device/Increase time);Without upper extremity assist;From chair/3-in-1   Five time sit to stand comments  16.41   Stand to Sit 6: Modified independent (Device/Increase time);Without upper extremity assist;To chair/3-in-1     Ambulation/Gait   Ambulation/Gait Yes   Ambulation/Gait Assistance 6: Modified independent (Device/Increase time)   Ambulation Distance (Feet) 200 Feet   Assistive device None   Gait Pattern Step-through pattern;Decreased arm swing - right;Decreased arm swing - left;Decreased step length - right;Decreased step length - left;Decreased trunk rotation;Trunk flexed;Narrow base of support;Poor foot clearance - left;Poor foot clearance - right  bilateral internal rotation of feet   Ambulation Surface Level   Gait velocity 11.66 sec = 2.81 ft/sec     Standardized Balance Assessment   Standardized Balance Assessment Timed Up and Go Test     Timed Up and Go Test   Normal TUG (seconds) 15.4   Manual TUG (seconds) 15.97   Cognitive TUG (seconds) 14.94   TUG Comments Scores >13.5-15 seconds indicate increased fall risk.     Functional Gait  Assessment   Gait assessed  Yes   Gait Level Surface Walks 20 ft, slow speed, abnormal gait pattern, evidence for imbalance or deviates 10-15 in outside of the 12 in walkway width. Requires more than 7 sec to ambulate 20 ft.  7.44   Change in Gait Speed Able to smoothly change walking speed without loss of balance or gait deviation. Deviate no more than 6 in outside of the 12 in walkway width.   Gait with Horizontal Head Turns Performs head turns smoothly with slight change in gait velocity (eg, minor disruption to smooth gait path), deviates 6-10 in outside 12 in walkway width, or uses an assistive device.   Gait with  Vertical Head Turns Performs task with slight change in gait velocity (eg, minor disruption to smooth gait path), deviates 6 - 10 in outside 12 in walkway width or uses assistive device   Gait and Pivot Turn Pivot turns safely in greater than 3 sec and stops with no loss of balance, or pivot turns safely within 3 sec and stops with mild imbalance, requires small steps to catch balance.   Step Over Obstacle Is able to step over one shoe box (4.5 in total height) without changing gait speed. No evidence of imbalance.   Gait  with Narrow Base of Support Ambulates less than 4 steps heel to toe or cannot perform without assistance.   Gait with Eyes Closed Walks 20 ft, slow speed, abnormal gait pattern, evidence for imbalance, deviates 10-15 in outside 12 in walkway width. Requires more than 9 sec to ambulate 20 ft.  10.19   Ambulating Backwards Walks 20 ft, slow speed, abnormal gait pattern, evidence for imbalance, deviates 10-15 in outside 12 in walkway width.  22.03   Steps Alternating feet, must use rail.   Total Score 16   FGA comment: Scores <22/30 indicate increased fall risk.     RLE Tone   RLE Tone Within Functional Limits     LLE Tone   LLE Tone Mild            Objective measurements completed on examination: See above findings.                    PT Short Term Goals - 11/23/16 2024      PT SHORT TERM GOAL #1   Title Pt will be independent with HEP to address Parkinson's specific deficits.  TARGET 12/24/16   Time 4   Period Weeks   Status New   Target Date 12/24/16     PT SHORT TERM GOAL #2   Title Pt will improve 5x sit<>stand to less than or equal to 13 seconds for improved efficiency and safety with transfers.   Time 4   Period Weeks   Status New   Target Date 12/24/16     PT SHORT TERM GOAL #3   Title Pt will improve TUG score to less than or equal to 13.5 sec for decreased fall risk.   Time 4   Period Weeks   Status New   Target Date 12/24/16      PT SHORT TERM GOAL #4   Title Pt will verbalize understanding of local Parkinson's disease-related resources in community.   Time 4   Period Weeks   Status New   Target Date 12/24/16     PT SHORT TERM GOAL #5   Title Pt will improve Functional Gait Assessment to at least 19/30 for decreased fall risk.   Time 4   Period Weeks   Status New   Target Date 12/24/16           PT Long Term Goals - 11/23/16 2027      PT LONG TERM GOAL #1   Title Pt will verbalize understanding of fall prevention in home environment.  TARGET 01/21/17   Time 8   Period Weeks   Status New   Target Date 01/21/17     PT LONG TERM GOAL #2   Title Pt will perform at least 8 of 10 reps of sit<>stand from <18" surfaces, modified independently, for improved low surface transfers.   Time 8   Period Weeks   Status New   Target Date 01/21/17     PT LONG TERM GOAL #3   Title Pt will improve TUG manual score to less than or equal to 14.5 seconds for decreased fall risk.   Time 8   Period Weeks   Status New   Target Date 01/21/17     PT LONG TERM GOAL #4   Title Pt will improve Functional Gait Assessment score to at least 22/30 for decreased fall risk.   Time 8   Period Weeks   Status New   Target Date 01/21/17     PT  LONG TERM GOAL #5   Title Pt will ambulate at least 1000 ft, indoor and outdoor surfaces, independently, for improved community gait.   Time 8   Period Weeks   Status New   Target Date 01/21/17     Additional Long Term Goals   Additional Long Term Goals Yes     PT LONG TERM GOAL #6   Title Pt will verbalize plans for continued community fitness upon D/C from PT.   Time 8   Period Weeks   Status New   Target Date 01/21/17                Plan - 11/28/16 2017    Clinical Impression Statement Pt is a 61 year old male who presents to OP PT with recent diagnosis of Parkinsonism.  He presents with bradykinesia, rigidity, decreased functional muscle strength, slowed  transfers, decreased balance, abnormal posture, decreased timing and coordination of gait.  Pt also reported questionable blood pressure changes causing dizziness at times. Pt has had no falls, but is at fall risk per TUG scores and FGA score of 16/30.  Pt has been limited over the past 9-12 months in doing cooking and household tasks as well as yardwork.  Pt would benefit from skilled PT to address the above stated deficits to decrease fall risk and improve functional mobility and participation in activities outside the home.   History and Personal Factors relevant to plan of care: >3 co-morbidities; currently lives alone, but may move back in with sister; >3 system involvement   Clinical Presentation Evolving   Clinical Presentation due to: fall risk per TUG scores and FGA, recent dx of Parkinsonism, awaiting DAT scan   Clinical Decision Making Moderate   Rehab Potential Good   PT Frequency 2x / week   PT Duration 8 weeks  plus eval   PT Treatment/Interventions ADLs/Self Care Home Management;Gait training;Neuromuscular re-education;Balance training;Therapeutic exercise;Therapeutic activities;Functional mobility training;Patient/family education   PT Next Visit Plan Initiate HEP for PWR! Moves in sitting/standing to address posture, trunk flexibility, weightshift and transition step; work on sit<>stand transfers   Consulted and Agree with Plan of Care Patient      Patient will benefit from skilled therapeutic intervention in order to improve the following deficits and impairments:  Abnormal gait, Decreased balance, Decreased mobility, Decreased strength, Difficulty walking, Impaired tone, Postural dysfunction, Impaired flexibility  Visit Diagnosis: Other abnormalities of gait and mobility  Unsteadiness on feet  Other symptoms and signs involving the nervous system  Abnormal posture      G-Codes - Nov 28, 2016 2034    Functional Assessment Tool Used (Outpatient Only) 5x sit<>stand:  16.41  sec, TUG 15.4 sec, TUG man15.97 sec, TUG cog 14.94 sec; FGA 16/30, gait velocity 2.81 ft/sec   Functional Limitation Mobility: Walking and moving around   Mobility: Walking and Moving Around Current Status [phone]) At least 40 percent but less than 60 percent impaired, limited or restricted   Mobility: Walking and Moving Around Goal Status [phone]) At least 40 percent but less than 60 percent impaired, limited or restricted       Problem List Patient Active Problem List   Diagnosis Date Noted  . Parkinsonism ([place]) 11/04/2016  . Gait abnormality 11/04/2016  . Hypothyroidism (acquired) 05/16/2013  . Encounter for therapeutic drug monitoring 03/23/2013  . Warfarin anticoagulation 10/01/2012  . Ejection fraction   . Atrial fibrillation ([place]) 08/08/2012  . Thyroid cancer ([place]) 08/27/2011  . Chronic kidney disease   . Hypertension  Randy [name] W. 11/23/2016, 8:35 PM Frazier Butt., PT   [place] [address] [place], [place], [address] Phone: [phone]   Fax:  [phone]  Name: Randy [name] MRN: [id number] Date of Birth: 1955/09/02

## 2016-11-25 DIAGNOSIS — R351 Nocturia: Secondary | ICD-10-CM | POA: Diagnosis not present

## 2016-11-25 DIAGNOSIS — N186 End stage renal disease: Secondary | ICD-10-CM | POA: Diagnosis not present

## 2016-11-25 DIAGNOSIS — N401 Enlarged prostate with lower urinary tract symptoms: Secondary | ICD-10-CM | POA: Diagnosis not present

## 2016-11-26 ENCOUNTER — Encounter (HOSPITAL_COMMUNITY)
Admission: RE | Admit: 2016-11-26 | Discharge: 2016-11-26 | Disposition: A | Discharge status: Home / Self Care | Payer: Medicare Other | Source: Ambulatory Visit | Attending: Neurology | Admitting: Neurology

## 2016-11-26 DIAGNOSIS — G3281 Cerebellar ataxia in diseases classified elsewhere: Secondary | ICD-10-CM | POA: Diagnosis not present

## 2016-11-26 DIAGNOSIS — G20 Parkinson's disease: Secondary | ICD-10-CM | POA: Insufficient documentation

## 2016-11-26 DIAGNOSIS — R269 Unspecified abnormalities of gait and mobility: Secondary | ICD-10-CM | POA: Insufficient documentation

## 2016-11-26 MED ORDER — IOFLUPANE I 123 185 MBQ/2.5ML IV SOLN
4.9000 | Freq: Once | INTRAVENOUS | Status: AC
  Administered 2016-11-26: 4.9 via INTRAVENOUS

## 2016-11-26 MED ORDER — IODINE STRONG (LUGOLS) 5 % PO SOLN
0.8000 mL | Freq: Once | ORAL | Status: AC
  Administered 2016-11-26: 0.8 mL via ORAL
  Filled 2016-11-26: qty 0.8

## 2016-11-29 ENCOUNTER — Telehealth: Payer: Self-pay | Admitting: Neurology

## 2016-11-29 NOTE — Telephone Encounter (Signed)
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

## 2016-11-29 NOTE — Telephone Encounter (Signed)
Spoke to patient - he is aware of results and will keep his pending follow up for further discussion.  He feels Sinemet 25/100, one tablet TID has been helpful. He just started PT last week and is hopeful for positive improvements.

## 2016-12-01 ENCOUNTER — Encounter: Payer: Self-pay | Admitting: Physical Therapy

## 2016-12-01 ENCOUNTER — Ambulatory Visit: Payer: Medicare Other | Admitting: Physical Therapy

## 2016-12-01 DIAGNOSIS — R2681 Unsteadiness on feet: Secondary | ICD-10-CM

## 2016-12-01 DIAGNOSIS — R293 Abnormal posture: Secondary | ICD-10-CM

## 2016-12-01 DIAGNOSIS — R29818 Other symptoms and signs involving the nervous system: Secondary | ICD-10-CM

## 2016-12-01 DIAGNOSIS — R2689 Other abnormalities of gait and mobility: Secondary | ICD-10-CM

## 2016-12-01 NOTE — Therapy (Signed)
[facility] [address] [place], [place], [address] Phone: [phone]   Fax:  [phone]  Physical Therapy Treatment  Patient Details  Name: Randy [name] MRN: [id number] Date of Birth: 1955-09-28 Referring Provider: Krista Blue  Encounter Date: 12/01/2016      PT End of Session - 12/01/16 1014    Visit Number 2   Number of Visits 16   Date for PT Re-Evaluation 01/22/17   Authorization Type Medicare-GCODE every 10th visit   PT Start Time 0930   PT Stop Time 1012   PT Time Calculation (min) 42 min   Activity Tolerance Patient tolerated treatment well   Behavior During Therapy [facility] for tasks assessed/performed      Past Medical History:  Diagnosis Date  . Arthritis    knees  . Atrial fibrillation ([place])    dx 07/2012; CHADS2=2; coumadin started  . Chronic kidney disease    dialysis; kidney transplant  2004  . Diabetes ([place])   . Dyslipidemia   . Ejection fraction    a. Echo 7/14:  Mod LVH, EF 55-60%, Gr 2 DD, MAC, mild LAE, PASP 32, trivial Eff.  . Ejection fraction    .  Marland Kitchen History of verruca vulgaris   . Hypercalcemia   . Hyperlipidemia   . Hyperparathyroidism ([place])   . Hypertension   . Multiple thyroid nodules   . PAF (paroxysmal atrial fibrillation) ([place])   . 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 Preop cardiovascular exam    Preop clearance for thyroid surgery June, 2013  . Thyroid cancer ([place])   . Vitamin D deficiency   . Vitiligo     Past Surgical History:  Procedure Laterality Date  . KIDNEY TRANSPLANT  2004  . THYROIDECTOMY  08/17/2011   Procedure: THYROIDECTOMY;  Surgeon: Earnstine Regal, MD;  Location: [facility];  Service: General;  Laterality: N/A;    There were no vitals filed for this visit.      Subjective Assessment - 12/01/16 0934    Subjective No falls; nothing new to report   Currently in Pain? No/denies                         [facility] Adult PT  Treatment/Exercise - 12/01/16 0001      Ambulation/Gait   Ambulation/Gait Yes   Gait Comments worked in heel- toe gait pattern; initially with intermittent UE  support using hallway, supervision level.                                            P[facility]) - 12/01/16 0935  SITTING   PWR! Up 10x2   PWR! [employer]   PWR! Twist 10x2   PWR! Step 10x2   Comments Cues for Posture and sequencing          Balance Exercises - 12/01/16 1004      Balance Exercises: Standing   Tandem Gait Forward;Intermittent upper extremity support   Retro Gait 4 reps  intermittent UE           PT Education - 12/01/16 1004    Education provided Yes   Education Details PWR! Moves in sitting; correct sitting posture.   Person(s) Educated Patient   Methods Explanation;Demonstration;Verbal cues;Handout   Comprehension Verbalized understanding;Returned demonstration;Verbal cues required;Need further instruction  PT Short Term Goals - 11/23/16 2024      PT SHORT TERM GOAL #1   Title Pt will be independent with HEP to address Parkinson's specific deficits.  TARGET 12/24/16   Time 4   Period Weeks   Status New   Target Date 12/24/16     PT SHORT TERM GOAL #2   Title Pt will improve 5x sit<>stand to less than or equal to 13 seconds for improved efficiency and safety with transfers.   Time 4   Period Weeks   Status New   Target Date 12/24/16     PT SHORT TERM GOAL #3   Title Pt will improve TUG score to less than or equal to 13.5 sec for decreased fall risk.   Time 4   Period Weeks   Status New   Target Date 12/24/16     PT SHORT TERM GOAL #4   Title Pt will verbalize understanding of local Parkinson's disease-related resources in community.   Time 4   Period Weeks   Status New   Target Date 12/24/16     PT SHORT TERM GOAL #5   Title Pt will improve Functional Gait Assessment to at least 19/30 for decreased fall risk.   Time 4   Period Weeks   Status New   Target Date  12/24/16           PT Long Term Goals - 11/23/16 2027      PT LONG TERM GOAL #1   Title Pt will verbalize understanding of fall prevention in home environment.  TARGET 01/21/17   Time 8   Period Weeks   Status New   Target Date 01/21/17     PT LONG TERM GOAL #2   Title Pt will perform at least 8 of 10 reps of sit<>stand from <18" surfaces, modified independently, for improved low surface transfers.   Time 8   Period Weeks   Status New   Target Date 01/21/17     PT LONG TERM GOAL #3   Title Pt will improve TUG manual score to less than or equal to 14.5 seconds for decreased fall risk.   Time 8   Period Weeks   Status New   Target Date 01/21/17     PT LONG TERM GOAL #4   Title Pt will improve Functional Gait Assessment score to at least 22/30 for decreased fall risk.   Time 8   Period Weeks   Status New   Target Date 01/21/17     PT LONG TERM GOAL #5   Title Pt will ambulate at least 1000 ft, indoor and outdoor surfaces, independently, for improved community gait.   Time 8   Period Weeks   Status New   Target Date 01/21/17     Additional Long Term Goals   Additional Long Term Goals Yes     PT LONG TERM GOAL #6   Title Pt will verbalize plans for continued community fitness upon D/C from PT.   Time 8   Period Weeks   Status New   Target Date 01/21/17               Plan - 12/01/16 1215    Clinical Impression Statement Initiated HEP for PWR! Moves in sitting with cues for posture and sequence.  Work on balance with various types of walks; pt requiring intermittent UE support at supervision level.   Rehab Potential Good   PT Frequency 2x / week  PT Duration 8 weeks  plus eval   PT Treatment/Interventions ADLs/Self Care Home Management;Gait training;Neuromuscular re-education;Balance training;Therapeutic exercise;Therapeutic activities;Functional mobility training;Patient/family education   PT Next Visit Plan Add HEP for PWR! Moves in standing to  address posture, trunk flexibility, weightshift and transition step; work on sit<>stand transfers   Consulted and Agree with Plan of Care Patient      Patient will benefit from skilled therapeutic intervention in order to improve the following deficits and impairments:  Abnormal gait, Decreased balance, Decreased mobility, Decreased strength, Difficulty walking, Impaired tone, Postural dysfunction, Impaired flexibility  Visit Diagnosis: Other abnormalities of gait and mobility  Unsteadiness on feet  Other symptoms and signs involving the nervous system  Abnormal posture     Problem List Patient Active Problem List   Diagnosis Date Noted  . Parkinsonism ([place]) 11/04/2016  . Gait abnormality 11/04/2016  . Hypothyroidism (acquired) 05/16/2013  . Encounter for therapeutic drug monitoring 03/23/2013  . Warfarin anticoagulation 10/01/2012  . Ejection fraction   . Atrial fibrillation ([place]) 08/08/2012  . Thyroid cancer ([place]) 08/27/2011  . Chronic kidney disease   . Hypertension     Bjorn Loser, PTA  12/01/16, 12:18 PM [facility] [address] [place], [place], [address] Phone: [phone]   Fax:  [phone]  Name: Randy [name] MRN: [id number] Date of Birth: [birth year]

## 2016-12-06 ENCOUNTER — Ambulatory Visit: Payer: Medicare Other | Admitting: Physical Therapy

## 2016-12-06 ENCOUNTER — Encounter: Payer: Self-pay | Admitting: Physical Therapy

## 2016-12-06 DIAGNOSIS — R2689 Other abnormalities of gait and mobility: Secondary | ICD-10-CM | POA: Diagnosis not present

## 2016-12-06 DIAGNOSIS — R293 Abnormal posture: Secondary | ICD-10-CM | POA: Diagnosis not present

## 2016-12-06 DIAGNOSIS — R29818 Other symptoms and signs involving the nervous system: Secondary | ICD-10-CM

## 2016-12-06 DIAGNOSIS — R2681 Unsteadiness on feet: Secondary | ICD-10-CM | POA: Diagnosis not present

## 2016-12-06 NOTE — Therapy (Signed)
[facility] [address] [place], [place], [address] Phone: [phone]   Fax:  [phone]  Physical Therapy Treatment  Patient Details  Name: Randy [name] MRN: [id number] Date of Birth: 03-24-1955 Referring Provider: Krista Blue  Encounter Date: 12/06/2016      PT End of Session - 12/06/16 2000    Visit Number 3   Number of Visits 16   Date for PT Re-Evaluation 01/22/17   Authorization Type Medicare-GCODE every 10th visit   PT Start Time 1018   PT Stop Time 1100   PT Time Calculation (min) 42 min   Activity Tolerance Patient tolerated treatment well   Behavior During Therapy [facility] for tasks assessed/performed      Past Medical History:  Diagnosis Date  . Arthritis    knees  . Atrial fibrillation ([place])    dx 07/2012; CHADS2=2; coumadin started  . Chronic kidney disease    dialysis; kidney transplant  2004  . Diabetes ([place])   . Dyslipidemia   . Ejection fraction    a. Echo 7/14:  Mod LVH, EF 55-60%, Gr 2 DD, MAC, mild LAE, PASP 32, trivial Eff.  . Ejection fraction    .  Marland Kitchen History of verruca vulgaris   . Hypercalcemia   . Hyperlipidemia   . Hyperparathyroidism ([place])   . Hypertension   . Multiple thyroid nodules   . PAF (paroxysmal atrial fibrillation) ([place])   . 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 Preop cardiovascular exam    Preop clearance for thyroid surgery June, 2013  . Thyroid cancer ([place])   . Vitamin D deficiency   . Vitiligo     Past Surgical History:  Procedure Laterality Date  . KIDNEY TRANSPLANT  2004  . THYROIDECTOMY  08/17/2011   Procedure: THYROIDECTOMY;  Surgeon: Earnstine Regal, MD;  Location: [facility];  Service: General;  Laterality: N/A;    There were no vitals filed for this visit.      Subjective Assessment - 12/06/16 1022    Subjective No falls; nothing new to report. Did practice the exercises but not very much.    Pertinent History DM, A-fib, kidney transplant 2004, HTN    Patient Stated Goals Pt's goals for therapy are to improve strength in legs.   Currently in Pain? No/denies                         [facility] Adult PT Treatment/Exercise - 12/06/16 1954      Ambulation/Gait   Ambulation/Gait Yes   Ambulation/Gait Assistance 5: Supervision;4: Min assist   Ambulation/Gait Assistance Details vc for step length, increasing velocity, and for increasing arm swing   Ambulation Distance (Feet) 500 Feet   Assistive device None   Gait Pattern Step-through pattern;Decreased arm swing - right;Decreased arm swing - left;Decreased step length - right;Decreased step length - left;Decreased trunk rotation;Trunk flexed;Poor foot clearance - left;Poor foot clearance - right   Ambulation Surface Level   Pre-Gait Activities at counter, stepping forward and backward with single LE while other remains stationary; adding opposite arm swing (required significant decr in speed)   Gait Comments pt recalled instruction in heel to toe walking and demonstrated without cues; use of walking poles with PT behind holding poles to facilitate arm swing           PWR [facility]) - 12/06/16 1957    PWR! exercises Moves in sitting;Moves in standing   PWR! Up 10   PWR!  Rock 10   PWR! Up 20   PWR! Rock 20   PWR! Twist 20   PWR! Step 10   Comments min instructional cues to improve technique and allow time for stretching muscles             PT Education - 12/06/16 1959    Education provided Yes   Education Details additional cues for sitting PWR! exercises; provided new handout with tips written in margins   Person(s) Educated Patient   Methods Explanation;Demonstration;Verbal cues;Handout   Comprehension Verbalized understanding;Returned demonstration;Verbal cues required;Need further instruction          PT Short Term Goals - 11/23/16 2024      PT SHORT TERM GOAL #1   Title Pt will be independent with HEP to address Parkinson's specific deficits.  TARGET  12/24/16   Time 4   Period Weeks   Status New   Target Date 12/24/16     PT SHORT TERM GOAL #2   Title Pt will improve 5x sit<>stand to less than or equal to 13 seconds for improved efficiency and safety with transfers.   Time 4   Period Weeks   Status New   Target Date 12/24/16     PT SHORT TERM GOAL #3   Title Pt will improve TUG score to less than or equal to 13.5 sec for decreased fall risk.   Time 4   Period Weeks   Status New   Target Date 12/24/16     PT SHORT TERM GOAL #4   Title Pt will verbalize understanding of local Parkinson's disease-related resources in community.   Time 4   Period Weeks   Status New   Target Date 12/24/16     PT SHORT TERM GOAL #5   Title Pt will improve Functional Gait Assessment to at least 19/30 for decreased fall risk.   Time 4   Period Weeks   Status New   Target Date 12/24/16           PT Long Term Goals - 11/23/16 2027      PT LONG TERM GOAL #1   Title Pt will verbalize understanding of fall prevention in home environment.  TARGET 01/21/17   Time 8   Period Weeks   Status New   Target Date 01/21/17     PT LONG TERM GOAL #2   Title Pt will perform at least 8 of 10 reps of sit<>stand from <18" surfaces, modified independently, for improved low surface transfers.   Time 8   Period Weeks   Status New   Target Date 01/21/17     PT LONG TERM GOAL #3   Title Pt will improve TUG manual score to less than or equal to 14.5 seconds for decreased fall risk.   Time 8   Period Weeks   Status New   Target Date 01/21/17     PT LONG TERM GOAL #4   Title Pt will improve Functional Gait Assessment score to at least 22/30 for decreased fall risk.   Time 8   Period Weeks   Status New   Target Date 01/21/17     PT LONG TERM GOAL #5   Title Pt will ambulate at least 1000 ft, indoor and outdoor surfaces, independently, for improved community gait.   Time 8   Period Weeks   Status New   Target Date 01/21/17     Additional Long  Term Goals   Additional Long Term Goals Yes  PT LONG TERM GOAL #6   Title Pt will verbalize plans for continued community fitness upon D/C from PT.   Time 8   Period Weeks   Status New   Target Date 01/21/17               Plan - 12/06/16 2001    Clinical Impression Statement Session focused on correct technique for PWR! sitting and initiating PWR! standing. Encouraged pt to try to do exercises each morning to help him start his day with improved, easier movement. Patient very engaged and anticipate will continue to benefit from PT.   Rehab Potential Good   PT Frequency 2x / week   PT Duration 8 weeks  plus eval   PT Treatment/Interventions ADLs/Self Care Home Management;Gait training;Neuromuscular re-education;Balance training;Therapeutic exercise;Therapeutic activities;Functional mobility training;Patient/family education   PT Next Visit Plan Introduce remaining PWR! standing and add to HEP if approp; work on sit<>stand transfers (?add to HEP); pre-gait and gait training for incr arm swing, trunk rotation   Consulted and Agree with Plan of Care Patient      Patient will benefit from skilled therapeutic intervention in order to improve the following deficits and impairments:  Abnormal gait, Decreased balance, Decreased mobility, Decreased strength, Difficulty walking, Impaired tone, Postural dysfunction, Impaired flexibility  Visit Diagnosis: Other abnormalities of gait and mobility  Unsteadiness on feet  Other symptoms and signs involving the nervous system  Abnormal posture     Problem List Patient Active Problem List   Diagnosis Date Noted  . Parkinsonism ([place]) 11/04/2016  . Gait abnormality 11/04/2016  . Hypothyroidism (acquired) 05/16/2013  . Encounter for therapeutic drug monitoring 03/23/2013  . Warfarin anticoagulation 10/01/2012  . Ejection fraction   . Atrial fibrillation ([place]) 08/08/2012  . Thyroid cancer ([place]) 08/27/2011  . Chronic kidney disease    . Hypertension     Rexanne Mano, PT 12/06/2016, 8:05 PM  [facility] [address] [place], [place], [address] Phone: [phone]   Fax:  [phone]  Name: Randy [name] MRN: [id number] Date of Birth: 13-Mar-1955

## 2016-12-08 ENCOUNTER — Encounter: Payer: Self-pay | Admitting: Physical Therapy

## 2016-12-08 ENCOUNTER — Ambulatory Visit: Payer: Medicare Other | Admitting: Physical Therapy

## 2016-12-08 DIAGNOSIS — R29818 Other symptoms and signs involving the nervous system: Secondary | ICD-10-CM

## 2016-12-08 DIAGNOSIS — R293 Abnormal posture: Secondary | ICD-10-CM

## 2016-12-08 DIAGNOSIS — R2689 Other abnormalities of gait and mobility: Secondary | ICD-10-CM | POA: Diagnosis not present

## 2016-12-08 DIAGNOSIS — R2681 Unsteadiness on feet: Secondary | ICD-10-CM | POA: Diagnosis not present

## 2016-12-08 NOTE — Therapy (Signed)
[facility] [address] [place], [place], [address] Phone: [phone]   Fax:  [phone]  Physical Therapy Treatment  Patient Details  Name: Randy [name] MRN: [id number] Date of Birth: 1955/07/25 Referring Provider: Krista Blue  Encounter Date: 12/08/2016      PT End of Session - 12/08/16 1015    Visit Number 4   Number of Visits 16   Date for PT Re-Evaluation 01/22/17   Authorization Type Medicare-GCODE every 10th visit   PT Start Time 0932   PT Stop Time 1013   PT Time Calculation (min) 41 min   Activity Tolerance Patient tolerated treatment well   Behavior During Therapy [facility] for tasks assessed/performed      Past Medical History:  Diagnosis Date  . Arthritis    knees  . Atrial fibrillation ([place])    dx 07/2012; CHADS2=2; coumadin started  . Chronic kidney disease    dialysis; kidney transplant  2004  . Diabetes ([place])   . Dyslipidemia   . Ejection fraction    a. Echo 7/14:  Mod LVH, EF 55-60%, Gr 2 DD, MAC, mild LAE, PASP 32, trivial Eff.  . Ejection fraction    .  Marland Kitchen History of verruca vulgaris   . Hypercalcemia   . Hyperlipidemia   . Hyperparathyroidism ([place])   . Hypertension   . Multiple thyroid nodules   . PAF (paroxysmal atrial fibrillation) ([place])   . 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 Preop cardiovascular exam    Preop clearance for thyroid surgery June, 2013  . Thyroid cancer ([place])   . Vitamin D deficiency   . Vitiligo     Past Surgical History:  Procedure Laterality Date  . KIDNEY TRANSPLANT  2004  . THYROIDECTOMY  08/17/2011   Procedure: THYROIDECTOMY;  Surgeon: Earnstine Regal, MD;  Location: [facility];  Service: General;  Laterality: N/A;    There were no vitals filed for this visit.      Subjective Assessment - 12/08/16 0934    Subjective Practised his HEP yesterday. Pt may start walking routine with sister.   Pertinent History DM, A-fib, kidney transplant 2004, HTN   Patient Stated Goals Pt's goals for therapy are to improve strength in legs.   Currently in Pain? Yes   Pain Score 2    Pain Location Knee   Pain Orientation Left;Lower   Pain Descriptors / Indicators Aching   Pain Onset More than a month ago   Pain Frequency Intermittent   Aggravating Factors  hurts before it gets "warmed up".   Pain Relieving Factors warming up/ moving                         [facility] Adult PT Treatment/Exercise - 12/08/16 0001      Ambulation/Gait   Ambulation/Gait Yes   Ambulation/Gait Assistance 5: Supervision   Ambulation/Gait Assistance Details Practising normal stride length, arm swing, and visual scanning   Ambulation Distance (Feet) 800 Feet   Assistive device None   Gait Pattern Step-through pattern;Decreased arm swing - right;Decreased arm swing - left;Decreased step length - right;Decreased step length - left;Decreased trunk rotation;Trunk flexed;Poor foot clearance - left;Poor foot clearance - right   Ambulation Surface Level   Stairs Yes   Stairs Assistance 5: Supervision   Stairs Assistance Details (indicate cue type and reason) progressing from 2-1 rails; noted some difficulty with   coordinating feet with small turn.  PWR [facility]) - 12/08/16 1013 Standing   PWR! Up 10   PWR! Rock 10   PWR! Twist 10   Comments cues for technique and sequence          Balance Exercises - 12/08/16 0952      Balance Exercises: Standing   Stepping Strategy Anterior;Posterior;Lateral  training : multidirectional stepping with weight shifting an   Sit to Stand Time 10x2 working on balance, body mechanics and strength; cues for technique           PT Education - 12/08/16 1014    Education provided Yes   Education Details encouraged pt to walk with sister and verbally reviewed what to do for HEP.   Person(s) Educated Patient   Methods Explanation;Verbal cues;Handout   Comprehension Verbalized understanding;Need further  instruction          PT Short Term Goals - 11/23/16 2024      PT SHORT TERM GOAL #1   Title Pt will be independent with HEP to address Parkinson's specific deficits.  TARGET 12/24/16   Time 4   Period Weeks   Status New   Target Date 12/24/16     PT SHORT TERM GOAL #2   Title Pt will improve 5x sit<>stand to less than or equal to 13 seconds for improved efficiency and safety with transfers.   Time 4   Period Weeks   Status New   Target Date 12/24/16     PT SHORT TERM GOAL #3   Title Pt will improve TUG score to less than or equal to 13.5 sec for decreased fall risk.   Time 4   Period Weeks   Status New   Target Date 12/24/16     PT SHORT TERM GOAL #4   Title Pt will verbalize understanding of local Parkinson's disease-related resources in community.   Time 4   Period Weeks   Status New   Target Date 12/24/16     PT SHORT TERM GOAL #5   Title Pt will improve Functional Gait Assessment to at least 19/30 for decreased fall risk.   Time 4   Period Weeks   Status New   Target Date 12/24/16           PT Long Term Goals - 11/23/16 2027      PT LONG TERM GOAL #1   Title Pt will verbalize understanding of fall prevention in home environment.  TARGET 01/21/17   Time 8   Period Weeks   Status New   Target Date 01/21/17     PT LONG TERM GOAL #2   Title Pt will perform at least 8 of 10 reps of sit<>stand from <18" surfaces, modified independently, for improved low surface transfers.   Time 8   Period Weeks   Status New   Target Date 01/21/17     PT LONG TERM GOAL #3   Title Pt will improve TUG manual score to less than or equal to 14.5 seconds for decreased fall risk.   Time 8   Period Weeks   Status New   Target Date 01/21/17     PT LONG TERM GOAL #4   Title Pt will improve Functional Gait Assessment score to at least 22/30 for decreased fall risk.   Time 8   Period Weeks   Status New   Target Date 01/21/17     PT LONG TERM GOAL #5   Title Pt will  ambulate at least 1000 ft, indoor and  outdoor surfaces, independently, for improved community gait.   Time 8   Period Weeks   Status New   Target Date 01/21/17     Additional Long Term Goals   Additional Long Term Goals Yes     PT LONG TERM GOAL #6   Title Pt will verbalize plans for continued community fitness upon D/C from PT.   Time 8   Period Weeks   Status New   Target Date 01/21/17               Plan - 12/08/16 1206    Clinical Impression Statement Pt is progressing with understanding of PWR! standing and demonstrates carry over with gait mechanics training.  Pt demsontrates ability to perform stepping strategies with weight shifting but demonstrates imbalance maintaining position without UE support.                                                                              Rehab Potential Good   PT Frequency 2x / week   PT Duration 8 weeks  plus eval   PT Treatment/Interventions ADLs/Self Care Home Management;Gait training;Neuromuscular re-education;Balance training;Therapeutic exercise;Therapeutic activities;Functional mobility training;Patient/family education   PT Next Visit Plan Introduce remaining PWR! standing and add to HEP if approp; work on sit<>stand transfers (?add to HEP); pre-gait and gait training for incr arm swing, trunk rotation; balance with head turns during gait; stepping strategies.   Consulted and Agree with Plan of Care Patient      Patient will benefit from skilled therapeutic intervention in order to improve the following deficits and impairments:  Abnormal gait, Decreased balance, Decreased mobility, Decreased strength, Difficulty walking, Impaired tone, Postural dysfunction, Impaired flexibility  Visit Diagnosis: Other abnormalities of gait and mobility  Unsteadiness on feet  Other symptoms and signs involving the nervous system  Abnormal posture     Problem List Patient Active Problem List   Diagnosis Date Noted  .  Parkinsonism ([place]) 11/04/2016  . Gait abnormality 11/04/2016  . Hypothyroidism (acquired) 05/16/2013  . Encounter for therapeutic drug monitoring 03/23/2013  . Warfarin anticoagulation 10/01/2012  . Ejection fraction   . Atrial fibrillation ([place]) 08/08/2012  . Thyroid cancer ([place]) 08/27/2011  . Chronic kidney disease   . Hypertension    Bjorn Loser, PTA  12/08/16, 12:10 PM [facility] [address] [place], [place], [address] Phone: [phone]   Fax:  [phone]  Name: Randy [name] MRN: [id number] Date of Birth: 10-10-1955

## 2016-12-13 ENCOUNTER — Ambulatory Visit: Payer: Medicare Other | Admitting: Physical Therapy

## 2016-12-13 DIAGNOSIS — R29818 Other symptoms and signs involving the nervous system: Secondary | ICD-10-CM | POA: Diagnosis not present

## 2016-12-13 DIAGNOSIS — R2689 Other abnormalities of gait and mobility: Secondary | ICD-10-CM | POA: Diagnosis not present

## 2016-12-13 DIAGNOSIS — R293 Abnormal posture: Secondary | ICD-10-CM | POA: Diagnosis not present

## 2016-12-13 DIAGNOSIS — R2681 Unsteadiness on feet: Secondary | ICD-10-CM | POA: Diagnosis not present

## 2016-12-13 NOTE — Therapy (Signed)
[facility] [address] [place], [place], [address] Phone: [phone]   Fax:  [phone]  Physical Therapy Treatment  Patient Details  Name: Randy [name] MRN: [id number] Date of Birth: 1955-10-03 Referring Provider: Krista Blue  Encounter Date: 12/13/2016      PT End of Session - 12/13/16 1245    Visit Number 5   Number of Visits 16   Date for PT Re-Evaluation 01/22/17   Authorization Type Medicare-GCODE every 10th visit   PT Start Time 1020   PT Stop Time 1059   PT Time Calculation (min) 39 min   Activity Tolerance Patient tolerated treatment well   Behavior During Therapy [facility] for tasks assessed/performed      Past Medical History:  Diagnosis Date  . Arthritis    knees  . Atrial fibrillation ([place])    dx 07/2012; CHADS2=2; coumadin started  . Chronic kidney disease    dialysis; kidney transplant  2004  . Diabetes ([place])   . Dyslipidemia   . Ejection fraction    a. Echo 7/14:  Mod LVH, EF 55-60%, Gr 2 DD, MAC, mild LAE, PASP 32, trivial Eff.  . Ejection fraction    .  Marland Kitchen History of verruca vulgaris   . Hypercalcemia   . Hyperlipidemia   . Hyperparathyroidism ([place])   . Hypertension   . Multiple thyroid nodules   . PAF (paroxysmal atrial fibrillation) ([place])   . 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 Preop cardiovascular exam    Preop clearance for thyroid surgery June, 2013  . Thyroid cancer ([place])   . Vitamin D deficiency   . Vitiligo     Past Surgical History:  Procedure Laterality Date  . KIDNEY TRANSPLANT  2004  . THYROIDECTOMY  08/17/2011   Procedure: THYROIDECTOMY;  Surgeon: Earnstine Regal, MD;  Location: [facility];  Service: General;  Laterality: N/A;    There were no vitals filed for this visit.      Subjective Assessment - 12/13/16 1023    Subjective Noticing small improvements with therapy, but still not quite where I want to be.  Started walking with my sister, but not too far.  Maybe  about 10 minutes of walking.   Pertinent History DM, A-fib, kidney transplant 2004, HTN   Patient Stated Goals Pt's goals for therapy are to improve strength in legs.   Currently in Pain? Yes   Pain Score 3    Pain Location Knee   Pain Orientation Right;Left   Pain Descriptors / Indicators Aching   Pain Onset More than a month ago   Aggravating Factors  Walking aggravates   Pain Relieving Factors sitting down, warming up helps                         [facility] Adult PT Treatment/Exercise - 12/13/16 0001      Transfers   Transfers Sit to Stand;Stand to Sit   Sit to Stand 5: Supervision;With upper extremity assist;From chair/3-in-1;From bed   Stand to Sit 5: Supervision;With upper extremity assist;To bed;To chair/3-in-1   Number of Reps --  x 8 reps from mat surface, x 5 reps from chair   Transfer Cueing Cues for technique for improved ease of transfers.     Ambulation/Gait   Ambulation/Gait Yes   Ambulation/Gait Assistance 5: Supervision   Ambulation/Gait Assistance Details Cues for increased step length and heelstrike   Ambulation Distance (Feet) 800 Feet   Assistive device None  Gait Pattern Step-through pattern;Decreased arm swing - right;Decreased arm swing - left;Decreased step length - right;Decreased step length - left;Decreased trunk rotation;Poor foot clearance - left;Poor foot clearance - right   Ambulation Surface Level;Indoor   Gait Comments Forward/back walking, with cues for foot placement/smooth transition between forward/back direction     Exercises   Exercises Knee/Hip;Ankle     Knee/Hip Exercises: Stretches   Active Hamstring Stretch Right;Left;2 reps;30 seconds   Active Hamstring Stretch Limitations sitting, foot propped on floor     Knee/Hip Exercises: Seated   Long Arc Quad AROM;Right;Left;1 set;10 reps   Marching AROM;Right;Left;1 set;10 reps     Ankle Exercises: Seated   Heel Raises 10 reps   Toe Raise 10 reps           PWR  [facility]) - 12/13/16 1044    PWR! exercises Moves in standing   PWR! Up x 10   PWR! Rock x 10 reps each side   PWR! Twist x 10 reps each side   PWR Step x 10 reps each side   Comments Pt demo good technique, good remembering from performance last visit.             PT Education - 12/13/16 1244    Education provided Yes   Education Details HEP additions-PWR! Moves in standing; seated hamstring stretch and seated leg exercises   Person(s) Educated Patient   Methods Explanation;Demonstration;Handout   Comprehension Verbalized understanding;Returned demonstration          PT Short Term Goals - 11/23/16 2024      PT SHORT TERM GOAL #1   Title Pt will be independent with HEP to address Parkinson's specific deficits.  TARGET 12/24/16   Time 4   Period Weeks   Status New   Target Date 12/24/16     PT SHORT TERM GOAL #2   Title Pt will improve 5x sit<>stand to less than or equal to 13 seconds for improved efficiency and safety with transfers.   Time 4   Period Weeks   Status New   Target Date 12/24/16     PT SHORT TERM GOAL #3   Title Pt will improve TUG score to less than or equal to 13.5 sec for decreased fall risk.   Time 4   Period Weeks   Status New   Target Date 12/24/16     PT SHORT TERM GOAL #4   Title Pt will verbalize understanding of local Parkinson's disease-related resources in community.   Time 4   Period Weeks   Status New   Target Date 12/24/16     PT SHORT TERM GOAL #5   Title Pt will improve Functional Gait Assessment to at least 19/30 for decreased fall risk.   Time 4   Period Weeks   Status New   Target Date 12/24/16           PT Long Term Goals - 11/23/16 2027      PT LONG TERM GOAL #1   Title Pt will verbalize understanding of fall prevention in home environment.  TARGET 01/21/17   Time 8   Period Weeks   Status New   Target Date 01/21/17     PT LONG TERM GOAL #2   Title Pt will perform at least 8 of 10 reps of sit<>stand from  <18" surfaces, modified independently, for improved low surface transfers.   Time 8   Period Weeks   Status New   Target Date 01/21/17  PT LONG TERM GOAL #3   Title Pt will improve TUG manual score to less than or equal to 14.5 seconds for decreased fall risk.   Time 8   Period Weeks   Status New   Target Date 01/21/17     PT LONG TERM GOAL #4   Title Pt will improve Functional Gait Assessment score to at least 22/30 for decreased fall risk.   Time 8   Period Weeks   Status New   Target Date 01/21/17     PT LONG TERM GOAL #5   Title Pt will ambulate at least 1000 ft, indoor and outdoor surfaces, independently, for improved community gait.   Time 8   Period Weeks   Status New   Target Date 01/21/17     Additional Long Term Goals   Additional Long Term Goals Yes     PT LONG TERM GOAL #6   Title Pt will verbalize plans for continued community fitness upon D/C from PT.   Time 8   Period Weeks   Status New   Target Date 01/21/17               Plan - 12/13/16 1245    Clinical Impression Statement Pt continues to perform PWR! Moves with good understanding and good technique-provided full PWR! Moves in standing for HEP.  Continues to improve gait with reciprocal arm swing and foot clearance with gait.  Pt will continue to benefit from further skilled PT to address balance and gait.   Rehab Potential Good   PT Frequency 2x / week   PT Duration 8 weeks  plus eval   PT Treatment/Interventions ADLs/Self Care Home Management;Gait training;Neuromuscular re-education;Balance training;Therapeutic exercise;Therapeutic activities;Functional mobility training;Patient/family education   PT Next Visit Plan Review HEP provided this visit; continue activities to work on arm swing, trunk rotation, stepping strategies varied directions.  Begin checking goals and ?d/c next week (vs more appts)   Consulted and Agree with Plan of Care Patient      Patient will benefit from skilled  therapeutic intervention in order to improve the following deficits and impairments:  Abnormal gait, Decreased balance, Decreased mobility, Decreased strength, Difficulty walking, Impaired tone, Postural dysfunction, Impaired flexibility  Visit Diagnosis: Other symptoms and signs involving the nervous system  Unsteadiness on feet  Other abnormalities of gait and mobility     Problem List Patient Active Problem List   Diagnosis Date Noted  . Parkinsonism ([place]) 11/04/2016  . Gait abnormality 11/04/2016  . Hypothyroidism (acquired) 05/16/2013  . Encounter for therapeutic drug monitoring 03/23/2013  . Warfarin anticoagulation 10/01/2012  . Ejection fraction   . Atrial fibrillation ([place]) 08/08/2012  . Thyroid cancer ([place]) 08/27/2011  . Chronic kidney disease   . Hypertension     Randy [name] W. 12/13/2016, 12:49 PM  Frazier Butt., PT   [facility] [address] [place], [place], [address] Phone: [phone]   Fax:  [phone]  Name: Randy [name] MRN: [id number] Date of Birth: Oct 05, 1955

## 2016-12-13 NOTE — Patient Instructions (Addendum)
    HIP: Hamstrings - Short Sitting    Rest leg on raised surface or on the floor. Keep knee straight. Lift chest and lean forward. Hold _30__ seconds. _3__ reps per set each leg, __2_ sets per day.  Also, in sitting, you can do -seated marching x 10 reps     -seated leg kicks x 10 reps     -seated ankle pumps x 10 reps  Copyright  VHI. All rights reserved.

## 2016-12-17 ENCOUNTER — Ambulatory Visit: Payer: Medicare Other | Admitting: Physical Therapy

## 2016-12-20 ENCOUNTER — Ambulatory Visit: Payer: Medicare Other | Admitting: Physical Therapy

## 2016-12-20 DIAGNOSIS — R2689 Other abnormalities of gait and mobility: Secondary | ICD-10-CM

## 2016-12-20 DIAGNOSIS — R29818 Other symptoms and signs involving the nervous system: Secondary | ICD-10-CM

## 2016-12-20 DIAGNOSIS — R2681 Unsteadiness on feet: Secondary | ICD-10-CM | POA: Diagnosis not present

## 2016-12-20 DIAGNOSIS — R293 Abnormal posture: Secondary | ICD-10-CM | POA: Diagnosis not present

## 2016-12-20 NOTE — Therapy (Deleted)
[facility] [address] [place], [place], [address] Phone: [phone]   Fax:  [phone]  December 20, 2016   '@[email or web address]'$ @  Physical Therapy Discharge Summary  Patient: Randy [name]  MRN: [id number]  Date of Birth: [birth year]   Diagnosis: Unsteadiness on feet  Other abnormalities of gait and mobility  Other symptoms and signs involving the nervous system Referring Provider: Krista Blue  The above patient had been seen in Physical Therapy *** times of *** treatments scheduled with *** no shows and *** cancellations.  The treatment consisted of *** The patient is: {improved/worse/unchanged:[id number]}  Subjective: ***  Discharge Findings: ***  Functional Status at Discharge: ***  {XLKGM:[id number]}      Plan - 12/20/16 1503    Clinical Impression Statement STGs assessed this visit, with patient meeting STG 1, 4, 5.  STG for 5x sit<>stand not met, as pt's knees are painful with sit<>stand transfers with no UE support; pt has improved TUG score by 1 second, but did not met STG 3.  Pt is progressing with physical therapy, but would benefit from further skilled PT to make connection between exercises and functional mobility for improved mobility and decreased fall risk.   Rehab Potential Good   PT Frequency 2x / week   PT Duration 8 weeks  plus eval   PT Treatment/Interventions ADLs/Self Care Home Management;Gait training;Neuromuscular re-education;Balance training;Therapeutic exercise;Therapeutic activities;Functional mobility training;Patient/family education   PT Next Visit Plan Go through seated and standing PWR! Moves-1) with intensity, 2) with discussion on how each PWR! Move relates to function; work on sit<>stand from varied surfaces, use of momentum/hands for improved ease of transfers.   Consulted and Agree with Plan of Care Patient      Sincerely,   Frazier Butt., PT   CC '@[email or web address]'$ @  [facility] [address] [place], [place], [address] Phone: [phone]   Fax:  [phone]  Patient: Randy [name]  MRN: [id number]  Date of Birth: 09/16/1955

## 2016-12-20 NOTE — Therapy (Signed)
[facility] [address] [place], [place], [address] Phone: [phone]   Fax:  [phone]  Physical Therapy Treatment  Patient Details  Name: Randy [name] MRN: [id number] Date of Birth: Aug 13, 1955 Referring Provider: Krista Blue  Encounter Date: 12/20/2016      PT End of Session - 12/20/16 1502    Visit Number 6   Number of Visits 16   Date for PT Re-Evaluation 01/22/17   Authorization Type Medicare-GCODE every 10th visit   PT Start Time 1022   PT Stop Time 1101   PT Time Calculation (min) 39 min   Activity Tolerance Patient tolerated treatment well   Behavior During Therapy [facility] for tasks assessed/performed      Past Medical History:  Diagnosis Date  . Arthritis    knees  . Atrial fibrillation ([place])    dx 07/2012; CHADS2=2; coumadin started  . Chronic kidney disease    dialysis; kidney transplant  2004  . Diabetes ([place])   . Dyslipidemia   . Ejection fraction    a. Echo 7/14:  Mod LVH, EF 55-60%, Gr 2 DD, MAC, mild LAE, PASP 32, trivial Eff.  . Ejection fraction    .  Marland Kitchen History of verruca vulgaris   . Hypercalcemia   . Hyperlipidemia   . Hyperparathyroidism ([place])   . Hypertension   . Multiple thyroid nodules   . PAF (paroxysmal atrial fibrillation) ([place])   . 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 Preop cardiovascular exam    Preop clearance for thyroid surgery June, 2013  . Thyroid cancer ([place])   . Vitamin D deficiency   . Vitiligo     Past Surgical History:  Procedure Laterality Date  . KIDNEY TRANSPLANT  2004  . THYROIDECTOMY  08/17/2011   Procedure: THYROIDECTOMY;  Surgeon: Earnstine Regal, MD;  Location: [facility];  Service: General;  Laterality: N/A;    There were no vitals filed for this visit.      Subjective Assessment - 12/20/16 1026    Subjective A little sore sometimes when I do the exercises.  No falls.   Pertinent History DM, A-fib, kidney transplant 2004, HTN   Patient Stated  Goals Pt's goals for therapy are to improve strength in legs.   Currently in Pain? No/denies   Pain Onset More than a month ago            [facility] PT Assessment - 12/20/16 0001      Functional Gait  Assessment   Gait assessed  Yes   Gait Level Surface Walks 20 ft in less than 7 sec but greater than 5.5 sec, uses assistive device, slower speed, mild gait deviations, or deviates 6-10 in outside of the 12 in walkway width.  6.28   Change in Gait Speed Able to smoothly change walking speed without loss of balance or gait deviation. Deviate no more than 6 in outside of the 12 in walkway width.   Gait with Horizontal Head Turns Performs head turns smoothly with no change in gait. Deviates no more than 6 in outside 12 in walkway width   Gait with Vertical Head Turns Performs head turns with no change in gait. Deviates no more than 6 in outside 12 in walkway width.   Gait and Pivot Turn Pivot turns safely in greater than 3 sec and stops with no loss of balance, or pivot turns safely within 3 sec and stops with mild imbalance, requires small steps to catch balance.  Step Over Obstacle Is able to step over one shoe box (4.5 in total height) without changing gait speed. No evidence of imbalance.   Gait with Narrow Base of Support Ambulates 4-7 steps.   Gait with Eyes Closed Walks 20 ft, uses assistive device, slower speed, mild gait deviations, deviates 6-10 in outside 12 in walkway width. Ambulates 20 ft in less than 9 sec but greater than 7 sec.  8.12   Ambulating Backwards Walks 20 ft, uses assistive device, slower speed, mild gait deviations, deviates 6-10 in outside 12 in walkway width.  14.72   Steps Alternating feet, must use rail.   Total Score 22   FGA comment: Scores <22/30 indicate increased fall risk.                     [facility] Adult PT Treatment/Exercise - 12/20/16 0001      Transfers   Transfers Sit to Stand;Stand to Sit   Sit to Stand 5: Supervision;Without upper  extremity assist;From chair/3-in-1   Five time sit to stand comments  16.56   Stand to Sit 5: Supervision;Without upper extremity assist;To chair/3-in-1     Standardized Balance Assessment   Standardized Balance Assessment Timed Up and Go Test     Timed Up and Go Test   TUG Normal TUG   Normal TUG (seconds) 14.56     Self-Care   Self-Care Other Self-Care Comments   Other Self-Care Comments  Discussed POC, progress towards goals, plans to continue PT towards LTGs.  Discussed and provided information on [employer] [facility]'s community group.     Exercises   Exercises Knee/Hip;Ankle  Reviewed exercises as part of HEP-pt return demo understand     Knee/Hip Exercises: Stretches   Active Hamstring Stretch Right;Left;2 reps;30 seconds   Active Hamstring Stretch Limitations sitting, foot propped on floor     Knee/Hip Exercises: Seated   Long Arc Quad AROM;Right;Left;1 set;10 reps   Marching AROM;Right;Left;1 set;10 reps     Ankle Exercises: Seated   Heel Raises 10 reps   Toe Raise 10 reps           PWR [facility]) - 12/20/16 1456    PWR! exercises Moves in standing   PWR! Up x 10   PWR! Rock x 10 reps   PWR! Twist x 10 reps   PWR Step x 10 reps   Comments Pt return demo understanding of standing PWR! Moves as part of HEP             PT Education - 12/20/16 1502    Education provided Yes   Education Details POC, progress towards goals, plans to continue towards LTGs; [employer]) Educated Patient   Methods Explanation;Handout   Comprehension Verbalized understanding          PT Short Term Goals - 12/20/16 1038      PT SHORT TERM GOAL #1   Title Pt will be independent with HEP to address Parkinson's specific deficits.  TARGET 12/24/16   Time 4   Period Weeks   Status Achieved     PT SHORT TERM GOAL #2   Title Pt will improve 5x sit<>stand to less than or equal to 13 seconds for improved efficiency and safety with transfers.    Baseline 16.75 sec 12/20/16   Time 4   Period Weeks   Status Not Met     PT SHORT TERM GOAL #3   Title Pt will improve TUG score  to less than or equal to 13.5 sec for decreased fall risk.   Baseline 14.56 sec   Time 4   Period Weeks   Status Not Met     PT SHORT TERM GOAL #4   Title Pt will verbalize understanding of local Parkinson's disease-related resources in community.   Time 4   Period Weeks   Status Achieved     PT SHORT TERM GOAL #5   Title Pt will improve Functional Gait Assessment to at least 19/30 for decreased fall risk.   Baseline 22/30 on 12/20/16   Time 4   Period Weeks   Status Achieved           PT Long Term Goals - 11/23/16 2027      PT LONG TERM GOAL #1   Title Pt will verbalize understanding of fall prevention in home environment.  TARGET 01/21/17   Time 8   Period Weeks   Status New   Target Date 01/21/17     PT LONG TERM GOAL #2   Title Pt will perform at least 8 of 10 reps of sit<>stand from <18" surfaces, modified independently, for improved low surface transfers.   Time 8   Period Weeks   Status New   Target Date 01/21/17     PT LONG TERM GOAL #3   Title Pt will improve TUG manual score to less than or equal to 14.5 seconds for decreased fall risk.   Time 8   Period Weeks   Status New   Target Date 01/21/17     PT LONG TERM GOAL #4   Title Pt will improve Functional Gait Assessment score to at least 22/30 for decreased fall risk.   Time 8   Period Weeks   Status New   Target Date 01/21/17     PT LONG TERM GOAL #5   Title Pt will ambulate at least 1000 ft, indoor and outdoor surfaces, independently, for improved community gait.   Time 8   Period Weeks   Status New   Target Date 01/21/17     Additional Long Term Goals   Additional Long Term Goals Yes     PT LONG TERM GOAL #6   Title Pt will verbalize plans for continued community fitness upon D/C from PT.   Time 8   Period Weeks   Status New   Target Date 01/21/17                Plan - 12/20/16 1503    Clinical Impression Statement STGs assessed this visit, with patient meeting STG 1, 4, 5.  STG for 5x sit<>stand not met, as pt's knees are painful with sit<>stand transfers with no UE support; pt has improved TUG score by 1 second, but did not met STG 3.  Pt is progressing with physical therapy, but would benefit from further skilled PT to make connection between exercises and functional mobility for improved mobility and decreased fall risk.   Rehab Potential Good   PT Frequency 2x / week   PT Duration 8 weeks  plus eval   PT Treatment/Interventions ADLs/Self Care Home Management;Gait training;Neuromuscular re-education;Balance training;Therapeutic exercise;Therapeutic activities;Functional mobility training;Patient/family education   PT Next Visit Plan Go through seated and standing PWR! Moves-1) with intensity, 2) with discussion on how each PWR! Move relates to function; work on sit<>stand from varied surfaces, use of momentum/hands for improved ease of transfers.   Consulted and Agree with Plan of Care Patient  Patient will benefit from skilled therapeutic intervention in order to improve the following deficits and impairments:  Abnormal gait, Decreased balance, Decreased mobility, Decreased strength, Difficulty walking, Impaired tone, Postural dysfunction, Impaired flexibility  Visit Diagnosis: Unsteadiness on feet  Other abnormalities of gait and mobility  Other symptoms and signs involving the nervous system     Problem List Patient Active Problem List   Diagnosis Date Noted  . Parkinsonism ([place]) 11/04/2016  . Gait abnormality 11/04/2016  . Hypothyroidism (acquired) 05/16/2013  . Encounter for therapeutic drug monitoring 03/23/2013  . Warfarin anticoagulation 10/01/2012  . Ejection fraction   . Atrial fibrillation ([place]) 08/08/2012  . Thyroid cancer ([place]) 08/27/2011  . Chronic kidney disease   . Hypertension      Abbegayle Denault W. 12/20/2016, 3:07 PM  Frazier Butt., PT   [facility] [address] [place], [place], [address] Phone: [phone]   Fax:  [phone]  Name: Randy [name] MRN: [id number] Date of Birth: [birth year]

## 2016-12-22 ENCOUNTER — Encounter: Payer: Self-pay | Admitting: Physical Therapy

## 2016-12-22 ENCOUNTER — Ambulatory Visit: Payer: Medicare Other | Admitting: Physical Therapy

## 2016-12-22 DIAGNOSIS — R293 Abnormal posture: Secondary | ICD-10-CM

## 2016-12-22 DIAGNOSIS — R2689 Other abnormalities of gait and mobility: Secondary | ICD-10-CM | POA: Diagnosis not present

## 2016-12-22 DIAGNOSIS — R29818 Other symptoms and signs involving the nervous system: Secondary | ICD-10-CM

## 2016-12-22 DIAGNOSIS — R2681 Unsteadiness on feet: Secondary | ICD-10-CM | POA: Diagnosis not present

## 2016-12-22 NOTE — Therapy (Signed)
[facility] [address] [place], [place], [address] Phone: [phone]   Fax:  [phone]  Physical Therapy Treatment  Patient Details  Name: Randy [name] MRN: [id number] Date of Birth: [birth year] Referring Provider: Krista Blue  Encounter Date: 12/22/2016      PT End of Session - 12/22/16 1216    Visit Number 7   Number of Visits 16   Date for PT Re-Evaluation 01/22/17   Authorization Type Medicare-GCODE every 10th visit   PT Start Time 1020   PT Stop Time 1105   PT Time Calculation (min) 45 min   Activity Tolerance Patient tolerated treatment well   Behavior During Therapy [facility] for tasks assessed/performed      Past Medical History:  Diagnosis Date  . Arthritis    knees  . Atrial fibrillation ([place])    dx 07/2012; CHADS2=2; coumadin started  . Chronic kidney disease    dialysis; kidney transplant  2004  . Diabetes ([place])   . Dyslipidemia   . Ejection fraction    a. Echo 7/14:  Mod LVH, EF 55-60%, Gr 2 DD, MAC, mild LAE, PASP 32, trivial Eff.  . Ejection fraction    .  Marland Kitchen History of verruca vulgaris   . Hypercalcemia   . Hyperlipidemia   . Hyperparathyroidism ([place])   . Hypertension   . Multiple thyroid nodules   . PAF (paroxysmal atrial fibrillation) ([place])   . 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 Preop cardiovascular exam    Preop clearance for thyroid surgery June, 2013  . Thyroid cancer ([place])   . Vitamin D deficiency   . Vitiligo     Past Surgical History:  Procedure Laterality Date  . KIDNEY TRANSPLANT  2004  . THYROIDECTOMY  08/17/2011   Procedure: THYROIDECTOMY;  Surgeon: Earnstine Regal, MD;  Location: [facility];  Service: General;  Laterality: N/A;    There were no vitals filed for this visit.      Subjective Assessment - 12/22/16 1022    Subjective  No falls.   Pertinent History DM, A-fib, kidney transplant 2004, HTN   Patient Stated Goals Pt's goals for therapy are to improve  strength in legs.   Currently in Pain? Yes   Pain Score 1    Pain Location Knee   Pain Orientation Right;Left   Pain Descriptors / Indicators Aching   Pain Type Chronic pain   Pain Onset More than a month ago   Pain Frequency Intermittent                         [facility] Adult PT Treatment/Exercise - 12/22/16 0001      Transfers   Transfers Floor to Transfer   Sit to Stand 6: Modified independent (Device/Increase time)  no external support needed   Transfer Cueing no cues needed; instructed on various fall scenerios for safety and problem solving.     Ambulation/Gait   Ambulation/Gait Yes   Ambulation/Gait Assistance 5: Supervision  bilateral UE support on treadmill   Ambulation/Gait Assistance Details worked in Aeronautical engineer in treadmill: increasing steplength, speed, and not scissoring feet; pt requested seated rest after 82mn due to shortness of breath, HR 124PBM, SPO2 98% after 2nd 262m walk on treadmill.                                     Ambulation  Distance (Feet) 200 Feet   Assistive device None   Gait Pattern Step-through pattern;Decreased arm swing - right;Decreased arm swing - left;Decreased step length - right;Decreased step length - left;Decreased trunk rotation;Poor foot clearance - left;Poor foot clearance - right   Ambulation Surface Level;Indoor     Knee/Hip Exercises: Stretches   Active Hamstring Stretch Right;Left;2 reps;30 seconds   Active Hamstring Stretch Limitations sitting, foot propped on floor           P[facility]) - 12/22/16 1043 Quadruped   PWR! Up x10   PWR! Rock x10   PWR! Twist x10   Comments Pt reported no knee pain; cues for technique with each.             PT Education - 12/22/16 1215    Education provided Yes   Education Details Floor transfers/saftey   Person(s) Educated Patient   Methods Explanation;Demonstration;Verbal cues   Comprehension Verbalized understanding          PT Short Term Goals - 12/20/16  1038      PT SHORT TERM GOAL #1   Title Pt will be independent with HEP to address Parkinson's specific deficits.  TARGET 12/24/16   Time 4   Period Weeks   Status Achieved     PT SHORT TERM GOAL #2   Title Pt will improve 5x sit<>stand to less than or equal to 13 seconds for improved efficiency and safety with transfers.   Baseline 16.75 sec 12/20/16   Time 4   Period Weeks   Status Not Met     PT SHORT TERM GOAL #3   Title Pt will improve TUG score to less than or equal to 13.5 sec for decreased fall risk.   Baseline 14.56 sec   Time 4   Period Weeks   Status Not Met     PT SHORT TERM GOAL #4   Title Pt will verbalize understanding of local Parkinson's disease-related resources in community.   Time 4   Period Weeks   Status Achieved     PT SHORT TERM GOAL #5   Title Pt will improve Functional Gait Assessment to at least 19/30 for decreased fall risk.   Baseline 22/30 on 12/20/16   Time 4   Period Weeks   Status Achieved           PT Long Term Goals - 11/23/16 2027      PT LONG TERM GOAL #1   Title Pt will verbalize understanding of fall prevention in home environment.  TARGET 01/21/17   Time 8   Period Weeks   Status New   Target Date 01/21/17     PT LONG TERM GOAL #2   Title Pt will perform at least 8 of 10 reps of sit<>stand from <18" surfaces, modified independently, for improved low surface transfers.   Time 8   Period Weeks   Status New   Target Date 01/21/17     PT LONG TERM GOAL #3   Title Pt will improve TUG manual score to less than or equal to 14.5 seconds for decreased fall risk.   Time 8   Period Weeks   Status New   Target Date 01/21/17     PT LONG TERM GOAL #4   Title Pt will improve Functional Gait Assessment score to at least 22/30 for decreased fall risk.   Time 8   Period Weeks   Status New   Target Date 01/21/17     PT LONG  TERM GOAL #5   Title Pt will ambulate at least 1000 ft, indoor and outdoor surfaces, independently, for  improved community gait.   Time 8   Period Weeks   Status New   Target Date 01/21/17     Additional Long Term Goals   Additional Long Term Goals Yes     PT LONG TERM GOAL #6   Title Pt will verbalize plans for continued community fitness upon D/C from PT.   Time 8   Period Weeks   Status New   Target Date 01/21/17               Plan - 12/22/16 1216    Clinical Impression Statement Pt tolerated the first 3 PWR! Moves in quadruped with no reports of increased knee pain.  Pt demonstrated Mod I with floor transfers without UE support but increased time needed.  Pt performed gait training on treadmill demonstrating increased step length and initial heelstrike with cues but very limited endurance; requesting seated rest break after 2 min.                                                                                                                 Clinical Decision Making Low   Rehab Potential Good   PT Frequency 2x / week   PT Duration 8 weeks  plus eval   PT Treatment/Interventions ADLs/Self Care Home Management;Gait training;Neuromuscular re-education;Balance training;Therapeutic exercise;Therapeutic activities;Functional mobility training;Patient/family education   PT Next Visit Plan Gait and endurance training on treadmill; trail seated stepper for continuing fitness option; give flyer for PWR! Moves class. Go through seated and standing PWR! Moves-1) with intensity, 2) with discussion on how each PWR! Move relates to function; work on sit<>stand from varied surfaces, use of momentum/hands for improved ease of transfers.   Consulted and Agree with Plan of Care Patient      Patient will benefit from skilled therapeutic intervention in order to improve the following deficits and impairments:  Abnormal gait, Decreased balance, Decreased mobility, Decreased strength, Difficulty walking, Impaired tone, Postural dysfunction, Impaired flexibility  Visit Diagnosis: Unsteadiness on  feet  Other abnormalities of gait and mobility  Other symptoms and signs involving the nervous system  Abnormal posture     Problem List Patient Active Problem List   Diagnosis Date Noted  . Parkinsonism ([place]) 11/04/2016  . Gait abnormality 11/04/2016  . Hypothyroidism (acquired) 05/16/2013  . Encounter for therapeutic drug monitoring 03/23/2013  . Warfarin anticoagulation 10/01/2012  . Ejection fraction   . Atrial fibrillation ([place]) 08/08/2012  . Thyroid cancer ([place]) 08/27/2011  . Chronic kidney disease   . Hypertension    Bjorn Loser, PTA  12/22/16, 12:22 PM [facility] [address] [place], [place], [address] Phone: [phone]   Fax:  [phone]  Name: Randy [name] MRN: [id number] Date of Birth: Dec 06, 1955

## 2016-12-29 ENCOUNTER — Ambulatory Visit: Payer: Medicare Other | Attending: Neurology | Admitting: Physical Therapy

## 2016-12-29 ENCOUNTER — Encounter: Payer: Self-pay | Admitting: Physical Therapy

## 2016-12-29 DIAGNOSIS — R2681 Unsteadiness on feet: Secondary | ICD-10-CM | POA: Insufficient documentation

## 2016-12-29 DIAGNOSIS — R293 Abnormal posture: Secondary | ICD-10-CM | POA: Insufficient documentation

## 2016-12-29 DIAGNOSIS — R2689 Other abnormalities of gait and mobility: Secondary | ICD-10-CM | POA: Diagnosis not present

## 2016-12-29 DIAGNOSIS — R29818 Other symptoms and signs involving the nervous system: Secondary | ICD-10-CM | POA: Insufficient documentation

## 2016-12-29 NOTE — Therapy (Signed)
[facility] [address] [place], [place], [address] Phone: [phone]   Fax:  [phone]  Physical Therapy Treatment  Patient Details  Name: Randy [name] MRN: [id number] Date of Birth: 01/09/1956 Referring Provider: Krista Blue   Encounter Date: 12/29/2016  PT End of Session - 12/29/16 1225    Visit Number  8    Number of Visits  16    Date for PT Re-Evaluation  01/22/17    Authorization Type  Medicare-GCODE every 10th visit    PT Start Time  1022    PT Stop Time  1104    PT Time Calculation (min)  42 min    Activity Tolerance  Patient tolerated treatment well    Behavior During Therapy  [facility] for tasks assessed/performed       Past Medical History:  Diagnosis Date  . Arthritis    knees  . Atrial fibrillation ([place])    dx 07/2012; CHADS2=2; coumadin started  . Chronic kidney disease    dialysis; kidney transplant  2004  . Diabetes ([place])   . Dyslipidemia   . Ejection fraction    a. Echo 7/14:  Mod LVH, EF 55-60%, Gr 2 DD, MAC, mild LAE, PASP 32, trivial Eff.  . Ejection fraction    .  Marland Kitchen History of verruca vulgaris   . Hypercalcemia   . Hyperlipidemia   . Hyperparathyroidism ([place])   . Hypertension   . Multiple thyroid nodules   . PAF (paroxysmal atrial fibrillation) ([place])   . 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 Preop cardiovascular exam    Preop clearance for thyroid surgery June, 2013  . Thyroid cancer ([place])   . Vitamin D deficiency   . Vitiligo     Past Surgical History:  Procedure Laterality Date  . KIDNEY TRANSPLANT  2004    There were no vitals filed for this visit.  Subjective Assessment - 12/29/16 1025    Subjective   Pt has been walking 10 min a couple times/week in a semi paved road for exercise using a bat for a walking stick. No falls.    Pertinent History  DM, A-fib, kidney transplant 2004, HTN    Patient Stated Goals  Pt's goals for therapy are to improve strength in legs.    Pain Onset  More than a month ago         [facility] PT Assessment - 12/29/16 0001      Timed Up and Go Test   TUG  Normal TUG    Normal TUG (seconds)  10.16      Functional Gait  Assessment   Gait assessed   Yes    Gait Level Surface  Walks 20 ft in less than 7 sec but greater than 5.5 sec, uses assistive device, slower speed, mild gait deviations, or deviates 6-10 in outside of the 12 in walkway width.    Change in Gait Speed  Able to smoothly change walking speed without loss of balance or gait deviation. Deviate no more than 6 in outside of the 12 in walkway width.    Gait with Horizontal Head Turns  Performs head turns smoothly with no change in gait. Deviates no more than 6 in outside 12 in walkway width    Gait with Vertical Head Turns  Performs head turns with no change in gait. Deviates no more than 6 in outside 12 in walkway width.    Gait and Pivot Turn  Pivot turns safely within 3  sec and stops quickly with no loss of balance.    Step Over Obstacle  Is able to step over 2 stacked shoe boxes taped together (9 in total height) without changing gait speed. No evidence of imbalance.    Gait with Narrow Base of Support  Is able to ambulate for 10 steps heel to toe with no staggering.    Gait with Eyes Closed  Walks 20 ft, uses assistive device, slower speed, mild gait deviations, deviates 6-10 in outside 12 in walkway width. Ambulates 20 ft in less than 9 sec but greater than 7 sec.    Ambulating Backwards  Walks 20 ft, uses assistive device, slower speed, mild gait deviations, deviates 6-10 in outside 12 in walkway width.    Steps  Alternating feet, no rail.    Total Score  27    FGA comment:  Scores <22/30 indicate increased fall risk.                  [facility] Adult PT Treatment/Exercise - 12/29/16 0001      Transfers   Transfers  Sit to Stand    Sit to Stand  7: Independent    Five time sit to stand comments   15.78      Ambulation/Gait   Ambulation/Gait  Yes     Ambulation/Gait Assistance  6: Modified independent (Device/Increase time)    Ambulation/Gait Assistance Details  practised with walking poles; cues for technique; increased step length, arm swing, and speed    Ambulation Distance (Feet)  400 Feet    Assistive device  Other (Comment) walking poles   walking poles   Gait Pattern  Step-through pattern;Decreased arm swing - right;Decreased arm swing - left;Decreased step length - right;Decreased step length - left;Decreased trunk rotation;Poor foot clearance - left;Poor foot clearance - right    Ambulation Surface  Level;Indoor             PT Education - 12/29/16 1222    Education provided  Yes    Education Details  Discussed pt's current HEP and pt is independent. Pt verbalizes understanding of PD related resources.  Discussed goals checked and scheduling.  Educated pt on the importance of cardio exercise and options he may have with waling poles and places to walk in the winter.                                        Person(s) Educated  Patient    Methods  Explanation;Demonstration;Handout;Verbal cues    Comprehension  Verbalized understanding       PT Short Term Goals - 12/29/16 1225      PT SHORT TERM GOAL #1   Title  Pt will be independent with HEP to address Parkinson's specific deficits.  TARGET 12/24/16    Time  4    Period  Weeks    Status  Achieved      PT SHORT TERM GOAL #2   Title  Pt will improve 5x sit<>stand to less than or equal to 13 seconds for improved efficiency and safety with transfers.    Baseline  16.75 sec 12/20/16    Time  4    Period  Weeks    Status  Not Met      PT SHORT TERM GOAL #3   Title  Pt will improve TUG score to less than or equal to 13.5 sec for  decreased fall risk.    Baseline  14.56 sec    Time  4    Period  Weeks    Status  Not Met      PT SHORT TERM GOAL #4   Title  Pt will verbalize understanding of local Parkinson's disease-related resources in community.    Time  4    Period   Weeks    Status  Achieved      PT SHORT TERM GOAL #5   Title  Pt will improve Functional Gait Assessment to at least 19/30 for decreased fall risk.    Baseline  22/30 on 12/20/16    Time  4    Period  Weeks    Status  Achieved        PT Long Term Goals - 11/23/16 2027      PT LONG TERM GOAL #1   Title  Pt will verbalize understanding of fall prevention in home environment.  TARGET 01/21/17    Time  8    Period  Weeks    Status  New    Target Date  01/21/17      PT LONG TERM GOAL #2   Title  Pt will perform at least 8 of 10 reps of sit<>stand from <18" surfaces, modified independently, for improved low surface transfers.    Time  8    Period  Weeks    Status  New    Target Date  01/21/17      PT LONG TERM GOAL #3   Title  Pt will improve TUG manual score to less than or equal to 14.5 seconds for decreased fall risk.    Time  8    Period  Weeks    Status  New    Target Date  01/21/17      PT LONG TERM GOAL #4   Title  Pt will improve Functional Gait Assessment score to at least 22/30 for decreased fall risk.    Time  8    Period  Weeks    Status  New    Target Date  01/21/17      PT LONG TERM GOAL #5   Title  Pt will ambulate at least 1000 ft, indoor and outdoor surfaces, independently, for improved community gait.    Time  8    Period  Weeks    Status  New    Target Date  01/21/17      Additional Long Term Goals   Additional Long Term Goals  Yes      PT LONG TERM GOAL #6   Title  Pt will verbalize plans for continued community fitness upon D/C from PT.    Time  8    Period  Weeks    Status  New    Target Date  01/21/17            Plan - 12/29/16 1229    Clinical Impression Statement  Pt met the STG for TUG 10.16 sec and improved FGA to 27/30 from 22/30 on 10/29.  Pt demonstrated increased step length, speed, and arm swing with walking poles with cues required for technique.    Rehab Potential  Good    PT Frequency  2x / week    PT Duration  8  weeks plus eval   plus eval   PT Treatment/Interventions  ADLs/Self Care Home Management;Gait training;Neuromuscular re-education;Balance training;Therapeutic exercise;Therapeutic activities;Functional mobility training;Patient/family education    PT Next Visit Plan  Gait and endurance training on treadmill; trail seated stepper for continuing fitness option; give flyer for PWR! Moves class. Go through seated and standing PWR! Moves-1) with intensity, 2) with discussion on how each PWR! Move relates to function; work on sit<>stand from varied surfaces, use of momentum/hands for improved ease of transfers.    Consulted and Agree with Plan of Care  Patient       Patient will benefit from skilled therapeutic intervention in order to improve the following deficits and impairments:  Abnormal gait, Decreased balance, Decreased mobility, Decreased strength, Difficulty walking, Impaired tone, Postural dysfunction, Impaired flexibility  Visit Diagnosis: Unsteadiness on feet  Other abnormalities of gait and mobility  Other symptoms and signs involving the nervous system  Abnormal posture     Problem List Patient Active Problem List   Diagnosis Date Noted  . Parkinsonism ([place]) 11/04/2016  . Gait abnormality 11/04/2016  . Hypothyroidism (acquired) 05/16/2013  . Encounter for therapeutic drug monitoring 03/23/2013  . Warfarin anticoagulation 10/01/2012  . Ejection fraction   . Atrial fibrillation ([place]) 08/08/2012  . Thyroid cancer ([place]) 08/27/2011  . Chronic kidney disease   . Hypertension    Bjorn Loser, PTA  12/29/16, 12:33 PM [facility] [address] [place], [place], [address] Phone: [phone]   Fax:  [phone]  Name: Randy [name] MRN: [id number] Date of Birth: 03/10/1955

## 2016-12-31 ENCOUNTER — Ambulatory Visit: Payer: Medicare Other | Admitting: Physical Therapy

## 2017-01-04 ENCOUNTER — Ambulatory Visit: Payer: Medicare Other | Admitting: Physical Therapy

## 2017-01-04 ENCOUNTER — Encounter: Payer: Self-pay | Admitting: Physical Therapy

## 2017-01-04 DIAGNOSIS — R2689 Other abnormalities of gait and mobility: Secondary | ICD-10-CM

## 2017-01-04 DIAGNOSIS — R293 Abnormal posture: Secondary | ICD-10-CM

## 2017-01-04 DIAGNOSIS — R2681 Unsteadiness on feet: Secondary | ICD-10-CM | POA: Diagnosis not present

## 2017-01-04 DIAGNOSIS — R29818 Other symptoms and signs involving the nervous system: Secondary | ICD-10-CM

## 2017-01-04 NOTE — Therapy (Signed)
[facility] [address] [place], [place], [address] Phone: [phone]   Fax:  [phone]  Physical Therapy Treatment  Patient Details  Name: Randy [name] MRN: [id number] Date of Birth: 04/12/1955 Referring Provider: Yan   Encounter Date: 01/04/2017  PT End of Session - 01/04/17 1215    Visit Number  9    Number of Visits  16    Date for PT Re-Evaluation  01/22/17    Authorization Type  Medicare-GCODE every 10th visit    PT Start Time  1105    PT Stop Time  1145    PT Time Calculation (min)  40 min    Activity Tolerance  Patient tolerated treatment well    Behavior During Therapy  WFL for tasks assessed/performed       Past Medical History:  Diagnosis Date  . Arthritis    knees  . Atrial fibrillation (HCC)    dx 07/2012; CHADS2=2; coumadin started  . Chronic kidney disease    dialysis; kidney transplant  2004  . Diabetes (HCC)   . Dyslipidemia   . Ejection fraction    a. Echo 7/14:  Mod LVH, EF 55-60%, Gr 2 DD, MAC, mild LAE, PASP 32, trivial Eff.  . Ejection fraction    .  . History of verruca vulgaris   . Hypercalcemia   . Hyperlipidemia   . Hyperparathyroidism (HCC)   . Hypertension   . Multiple thyroid nodules   . PAF (paroxysmal atrial fibrillation) (HCC)   . Post-transplant diabetes mellitus (HCC)   . Preop cardiovascular exam    Preop clearance for thyroid surgery June, 2013  . Thyroid cancer (HCC)   . Vitamin D deficiency   . Vitiligo     Past Surgical History:  Procedure Laterality Date  . KIDNEY TRANSPLANT  2004    There were no vitals filed for this visit.  Subjective Assessment - 01/04/17 1109    Subjective  Doing some exercises at 9:00 am and 11:00 and then probably should do some in the evening. His back and joints feel better after exercises and feels he can move better.    Pertinent History  DM, A-fib, kidney transplant 2004, HTN    Patient Stated Goals  Pt's goals for  therapy are to improve strength in legs.    Currently in Pain?  Yes    Pain Score  5     Pain Location  Knee    Pain Orientation  Right;Left    Pain Descriptors / Indicators  Aching    Pain Type  Chronic pain    Pain Onset  More than a month ago    Pain Frequency  Intermittent                      [facility] Adult PT Treatment/Exercise - 01/04/17 1118      Ambulation/Gait   Ambulation/Gait Assistance  5: Supervision    Ambulation/Gait Assistance Details  vc for upright posture and increased UE swing; progressed to pt walking with walking poles (assisted with step length and posture) and then walked with poles held by PT behind him to assist with arm swing. Afterwards walked with improved posture, step length, and arm swing.    Ambulation Distance (Feet)  400 Feet 240, 240, 120    Gait Pattern  Step-through pattern;Decreased arm swing - right;Decreased arm swing - left;Decreased trunk rotation;Poor foot clearance - left;Poor foot clearance - right    Ambulation Surface    Level;Indoor      Knee/Hip Exercises: Aerobic   Nustep  seat 14, L4 x 5 minutes        PWR [facility]) - 01/04/17 1203    PWR! Rock  20    PWR! Twist  20    Comments  modified quadruped    PWR! Rock  20    Comments  in corner reaching across    [employer]! Up  20    PWR! Rock  20    PWR! Twist  20    PWR! Step  20            PT Short Term Goals - 12/29/16 1225      PT SHORT TERM GOAL #1   Title  Pt will be independent with HEP to address Parkinson's specific deficits.  TARGET 12/24/16    Time  4    Period  Weeks    Status  Achieved      PT SHORT TERM GOAL #2   Title  Pt will improve 5x sit<>stand to less than or equal to 13 seconds for improved efficiency and safety with transfers.    Baseline  16.75 sec 12/20/16    Time  4    Period  Weeks    Status  Not Met      PT SHORT TERM GOAL #3   Title  Pt will improve TUG score to less than or equal to 13.5 sec for decreased fall risk.    Baseline   14.56 sec    Time  4    Period  Weeks    Status  Not Met      PT SHORT TERM GOAL #4   Title  Pt will verbalize understanding of local Parkinson's disease-related resources in community.    Time  4    Period  Weeks    Status  Achieved      PT SHORT TERM GOAL #5   Title  Pt will improve Functional Gait Assessment to at least 19/30 for decreased fall risk.    Baseline  22/30 on 12/20/16    Time  4    Period  Weeks    Status  Achieved        PT Long Term Goals - 11/23/16 2027      PT LONG TERM GOAL #1   Title  Pt will verbalize understanding of fall prevention in home environment.  TARGET 01/21/17    Time  8    Period  Weeks    Status  New    Target Date  01/21/17      PT LONG TERM GOAL #2   Title  Pt will perform at least 8 of 10 reps of sit<>stand from <18" surfaces, modified independently, for improved low surface transfers.    Time  8    Period  Weeks    Status  New    Target Date  01/21/17      PT LONG TERM GOAL #3   Title  Pt will improve TUG manual score to less than or equal to 14.5 seconds for decreased fall risk.    Time  8    Period  Weeks    Status  New    Target Date  01/21/17      PT LONG TERM GOAL #4   Title  Pt will improve Functional Gait Assessment score to at least 22/30 for decreased fall risk.    Time  8    Period  Weeks  Status  New    Target Date  01/21/17      PT LONG TERM GOAL #5   Title  Pt will ambulate at least 1000 ft, indoor and outdoor surfaces, independently, for improved community gait.    Time  8    Period  Weeks    Status  New    Target Date  01/21/17      Additional Long Term Goals   Additional Long Term Goals  Yes      PT LONG TERM GOAL #6   Title  Pt will verbalize plans for continued community fitness upon D/C from PT.    Time  8    Period  Weeks    Status  New    Target Date  01/21/17            Plan - 01/04/17 1216    Clinical Impression Statement  Session focused on use of Nustep and PWR exercises at  beginning of session to decrease stiffness (especially in knees) and improve ROM/freedom of movement with gait training. By end of session, pt walking more upright, increased arm swing, increased step length, and incr velocity. Pt continues to benefit from PT.     Rehab Potential  Good    PT Frequency  2x / week    PT Duration  8 weeks plus eval    PT Treatment/Interventions  ADLs/Self Care Home Management;Gait training;Neuromuscular re-education;Balance training;Therapeutic exercise;Therapeutic activities;Functional mobility training;Patient/family education    PT Next Visit Plan Do G-code and PN; Gait and endurance training on treadmill; enjoyed Nustep for warm-up (? discuss as a continuing fitness option at [facility]?); Go through seated and standing PWR! Moves-1) with intensity, 2) with discussion on how each PWR! Move relates to function; work on sit<>stand from varied surfaces, use of momentum/hands for improved ease of transfers.    Consulted and Agree with Plan of Care  Patient       Patient will benefit from skilled therapeutic intervention in order to improve the following deficits and impairments:  Abnormal gait, Decreased balance, Decreased mobility, Decreased strength, Difficulty walking, Impaired tone, Postural dysfunction, Impaired flexibility  Visit Diagnosis: Unsteadiness on feet  Other abnormalities of gait and mobility  Other symptoms and signs involving the nervous system  Abnormal posture     Problem List Patient Active Problem List   Diagnosis Date Noted  . Parkinsonism ([place]) 11/04/2016  . Gait abnormality 11/04/2016  . Hypothyroidism (acquired) 05/16/2013  . Encounter for therapeutic drug monitoring 03/23/2013  . Warfarin anticoagulation 10/01/2012  . Ejection fraction   . Atrial fibrillation ([place]) 08/08/2012  . Thyroid cancer ([place]) 08/27/2011  . Chronic kidney disease   . Hypertension     Rexanne Mano, PT 01/04/2017, 12:20 PM  [facility] [address] [place], [place], [address] Phone: [phone]   Fax:  [phone]  Name: Randy [name] MRN: [id number] Date of Birth: 1955/12/17

## 2017-01-05 ENCOUNTER — Encounter: Payer: Self-pay | Admitting: Neurology

## 2017-01-05 ENCOUNTER — Other Ambulatory Visit: Payer: Self-pay | Admitting: *Deleted

## 2017-01-05 ENCOUNTER — Ambulatory Visit (INDEPENDENT_AMBULATORY_CARE_PROVIDER_SITE_OTHER): Payer: Medicare Other | Admitting: Neurology

## 2017-01-05 VITALS — BP 112/77 | HR 74 | Ht 74.0 in | Wt 168.0 lb

## 2017-01-05 DIAGNOSIS — N189 Chronic kidney disease, unspecified: Secondary | ICD-10-CM | POA: Diagnosis not present

## 2017-01-05 DIAGNOSIS — G20 Parkinson's disease: Secondary | ICD-10-CM | POA: Diagnosis not present

## 2017-01-05 DIAGNOSIS — I482 Chronic atrial fibrillation, unspecified: Secondary | ICD-10-CM

## 2017-01-05 MED ORDER — ROPINIROLE HCL 1 MG PO TABS: 1.0000 mg | ORAL_TABLET | Freq: Three times a day (TID) | ORAL | 5 refills | Status: DC

## 2017-01-05 MED ORDER — CARBIDOPA-LEVODOPA 25-100 MG PO TABS: 1.0000 | ORAL_TABLET | Freq: Three times a day (TID) | ORAL | 4 refills | Status: DC

## 2017-01-05 MED ORDER — ROPINIROLE HCL ER 2 MG PO TB24: 2.0000 mg | ORAL_TABLET | Freq: Every day | ORAL | 11 refills | Status: DC

## 2017-01-05 NOTE — Progress Notes (Signed)
PATIENT: Randy [name] DOB: 1955-04-23  Chief Complaint  Patient presents with  . Parkinsonism/Gait abnormality    He is here with his sister, Randy [name].  They would like to review his DatScan and MRI results.  Feels both Sinemet and PT have improved his symptoms.     HISTORICAL  Randy [name] is a 61 year old male, accompanied by his sister Randy [name], seen in refer by  his primary care doctor Randy [name] for evaluation of tremor, initial evaluation November 04 2016.  I reviewed and summarized referring note, he had a history of diabetes, atrial fibrillation, on chronic Coumadin treatment, kidney transplant on November 09 2002,  thyroidectomy, on supplement, hypertension, hyperlipidemia,  He noted gradual onset tremor since 2017, initially affecting left foot, gradually getting worse, now involving both arm, he also noticed mild unsteady gait, stooped forward, tends to lose his balance, decreased arm swing,  He become less active, complains of weight loss over the past 1 year, he denied loss sense of smell, he sleeps well, no REM sleep disorder, no constipation  There is no family history of termore.  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  UPDATE Jan 05 2017: Data scan showed clear loss of dopamine transporter protein in bilateral straight out, most severe in the putamen, but also involving the caudate head,  left greater than right in the typical pattern  of Parkinson syndrome.  MRI of the brain in August 2018 showed mild atrophy, chronic small vessel disease. MRI of cervical spine in September 2018 showed mild spondylitic disease, there was no significant canal or foraminal narrowing He is now taking Sinemet 25/100 mg at 8, noon, 7 PM,  REVIEW OF SYSTEMS: Full 14 system review of systems performed and notable only for  chills,  ALLERGIES: No Known Allergies  HOME MEDICATIONS: Current Outpatient Medications  Medication Sig Dispense Refill  . amLODipine (NORVASC) 10 MG tablet Take 10 mg by mouth 2 (two) times daily.    Marland Kitchen atorvastatin (LIPITOR) 80 MG tablet Take 80 mg by mouth daily.    . carbidopa-levodopa (SINEMET IR) 25-100 MG tablet Take 1 tablet by mouth 3 (three) times daily. 90 tablet 6  . cloNIDine (CATAPRES) 0.3 MG tablet Take 0.3 mg by mouth 2 (two) times daily.    . cycloSPORINE modified (NEORAL) 100 MG capsule Take 125 mg by mouth 2 (two) times daily.     . enalapril (VASOTEC) 5 MG tablet Take 5 mg by mouth at bedtime.     . furosemide (LASIX) 40 MG tablet Take 40 mg by mouth daily.    Marland Kitchen glipiZIDE (GLUCOTROL XL) 5 MG 24 hr tablet Take 5 mg by mouth daily.    Marland Kitchen levothyroxine (SYNTHROID, LEVOTHROID) 137 MCG tablet Take 137 mcg by mouth daily before breakfast.    . metoprolol succinate (TOPROL-XL) 50 MG 24 hr tablet Take 1 tablet (50 mg total) by mouth daily. Take with or immediately following a meal. 90 tablet 3  . mycophenolate (MYFORTIC) 360 MG TBEC Take 720 mg by  mouth 2 (two) times daily.     . predniSONE (DELTASONE) 5 MG tablet Take 5 mg by mouth daily.    Marland Kitchen warfarin (COUMADIN) 5 MG tablet USE AS DIRECTED BY COUMADIN CLINIC 30 tablet 3   No current facility-administered medications for this visit.     PAST MEDICAL HISTORY: Past Medical History:  Diagnosis Date  . Arthritis    knees  . Atrial fibrillation ([place])    dx 07/2012; CHADS2=2; coumadin started  . Chronic kidney disease    dialysis; kidney transplant  2004  .  Diabetes ([place])   . Dyslipidemia   . Ejection fraction    a. Echo 7/14:  Mod LVH, EF 55-60%, Gr 2 DD, MAC, mild LAE, PASP 32, trivial Eff.  . Ejection fraction    .  Marland Kitchen History of verruca vulgaris   . Hypercalcemia   . Hyperlipidemia   . Hyperparathyroidism ([place])   . Hypertension   . Multiple thyroid nodules   . PAF (paroxysmal atrial fibrillation) ([place])   . Post-transplant diabetes mellitus ([place])   . Preop cardiovascular exam    Preop clearance for thyroid surgery June, 2013  . Thyroid cancer ([place])   . Vitamin D deficiency   . Vitiligo     PAST SURGICAL HISTORY: Past Surgical History:  Procedure Laterality Date  . KIDNEY TRANSPLANT  2004    FAMILY HISTORY: Family History  Problem Relation Age of Onset  . Dementia Mother   . Diabetes Mother   . Stroke Father   . Diabetes Father     SOCIAL HISTORY:  Social History   Socioeconomic History  . Marital status: Single    Spouse name: Not on file  . Number of children: 2  . Years of education: HS  . Highest education level: Not on file  Social Needs  . Financial resource strain: Not on file  . Food insecurity - worry: Not on file  . Food insecurity - inability: Not on file  . Transportation needs - medical: Not on file  . Transportation needs - non-medical: Not on file  Occupational History  . Occupation: Disabled  Tobacco Use  . Smoking status: Never Smoker  . Smokeless tobacco: Never Used  Substance and Sexual Activity  . Alcohol use: No    Comment: no use since 2013  . Drug use: No    Comment: previous use - not since 1993  . Sexual activity: Not on file  Other Topics Concern  . Not on file  Social History Narrative   Lives at home alone.   Right-handed.   12 ounces of caffeine weekly.     PHYSICAL EXAM   Vitals:   01/05/17 1004  BP: 112/77  Pulse: 74  Weight: 168 lb (76.2 kg)  Height: _0  (1.88 m)    Not recorded      Body mass index is 21.57 kg/m.  PHYSICAL EXAMNIATION:  Gen:  NAD, conversant, well nourised, obese, well groomed                     Cardiovascular: Regular rate rhythm, no peripheral edema, warm, nontender. Eyes: Conjunctivae clear without exudates or hemorrhage Neck: Supple, no carotid bruits. Pulmonary: Clear to auscultation bilaterally   NEUROLOGICAL EXAM:  MENTAL STATUS: Speech:    Speech is normal; fluent and spontaneous with normal comprehension.  Cognition:     Orientation to time, place and person     Normal recent and remote memory     Normal  Attention span and concentration     Normal Language, naming, repeating,spontaneous speech     Fund of knowledge   CRANIAL NERVES: CN II: Visual fields are full to confrontation. Fundoscopic exam is normal with sharp discs and no vascular changes. Pupils are round equal and briskly reactive to light. CN III, IV, VI: extraocular movement are normal. No ptosis. CN V: Facial sensation is intact to pinprick in all 3 divisions bilaterally. Corneal responses are intact.  CN VII: Face is symmetric with normal eye closure and smile. CN VIII: Hearing is normal to rubbing fingers CN IX, X: Palate elevates symmetrically. Phonation is normal. CN XI: Head turning and shoulder shrug are intact CN XII: Tongue is midline with normal movements and no atrophy.  MOTOR: Left arm patent AVM, there was mild right more than left arm rigidity, increased with reinforcement maneuver, bradykinesia, no significant weakness, mild left foot, bilateral upper extremity resting tremor  REFLEXES: Reflexes are 3 and symmetric at the biceps, triceps, knees, and ankles. Plantar responses are extensor bilaterally.  SENSORY: Intact to light touch, pinprick, positional sensation and vibratory sensation are intact in fingers and toes.  COORDINATION: Rapid alternating movements and fine finger movements are intact. There is no dysmetria on finger-to-nose and heel-knee-shin.    GAIT/STANCE: He needs push up to get up from seated  position, stoop forward, mildly unsteady, cautious, decreased bilateral arm swing,  DIAGNOSTIC DATA (LABS, IMAGING, TESTING) - I reviewed patient records, labs, notes, testing and imaging myself where available.   ASSESSMENT AND PLAN  Matty Vanroekel is a 61 y.o. male   Idiopathic Parkinson's disease  DatScan showed decreased dopamine transporter protein at bilateral putamen, also involving caudate head, left worse than right,  keep Sinemet 25/100 tid  Add on requip xr 2 mg every night  Keep moderate exercise  Randy [name], M.D. Ph.D.  [facility] [address] [place], [place] [address] Ph: [phone] Fax: [phone]  CC: Randy Maes, MD

## 2017-01-07 ENCOUNTER — Ambulatory Visit: Payer: Medicare Other | Admitting: Physical Therapy

## 2017-01-07 ENCOUNTER — Encounter: Payer: Self-pay | Admitting: Physical Therapy

## 2017-01-07 DIAGNOSIS — R293 Abnormal posture: Secondary | ICD-10-CM | POA: Diagnosis not present

## 2017-01-07 DIAGNOSIS — R29818 Other symptoms and signs involving the nervous system: Secondary | ICD-10-CM | POA: Diagnosis not present

## 2017-01-07 DIAGNOSIS — R2689 Other abnormalities of gait and mobility: Secondary | ICD-10-CM | POA: Diagnosis not present

## 2017-01-07 DIAGNOSIS — R2681 Unsteadiness on feet: Secondary | ICD-10-CM | POA: Diagnosis not present

## 2017-01-07 NOTE — Therapy (Signed)
[facility] [address] [place], [place], [address] Phone: [phone]   Fax:  [phone]  Physical Therapy Treatment  Patient Details  Name: Randy [name] MRN: [id number] Date of Birth: December 01, 1955 Referring Provider: Krista Blue   Encounter Date: 01/07/2017  PT End of Session - 01/07/17 1213    Visit Number  10    Number of Visits  16    Date for PT Re-Evaluation  01/22/17    Authorization Type  Medicare-GCODE every 10th visit    PT Start Time  1015    PT Stop Time  1102    PT Time Calculation (min)  47 min    Activity Tolerance  Patient tolerated treatment well    Behavior During Therapy  [facility] for tasks assessed/performed       Past Medical History:  Diagnosis Date  . Arthritis    knees  . Atrial fibrillation ([place])    dx 07/2012; CHADS2=2; coumadin started  . Chronic kidney disease    dialysis; kidney transplant  2004  . Diabetes ([place])   . Dyslipidemia   . Ejection fraction    a. Echo 7/14:  Mod LVH, EF 55-60%, Gr 2 DD, MAC, mild LAE, PASP 32, trivial Eff.  . Ejection fraction    .  Marland Kitchen History of verruca vulgaris   . Hypercalcemia   . Hyperlipidemia   . Hyperparathyroidism ([place])   . Hypertension   . Multiple thyroid nodules   . PAF (paroxysmal atrial fibrillation) ([place])   . 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 Preop cardiovascular exam    Preop clearance for thyroid surgery June, 2013  . Thyroid cancer ([place])   . Vitamin D deficiency   . Vitiligo     Past Surgical History:  Procedure Laterality Date  . KIDNEY TRANSPLANT  2004  . THYROIDECTOMY N/A 08/17/2011   Performed by Earnstine Regal, MD at [facility] ORS    There were no vitals filed for this visit.  Subjective Assessment - 01/07/17 1018    Subjective  Went to see Dr. Krista Blue the other day, and she added Requip.  Havne't lost anymore weight.    Pertinent History  DM, A-fib, kidney transplant 2004, HTN    Patient Stated Goals  Pt's goals for  therapy are to improve strength in legs.    Currently in Pain?  Yes    Pain Score  3     Pain Location  Knee    Pain Orientation  Right;Left    Pain Descriptors / Indicators  Aching    Pain Type  Chronic pain    Pain Onset  More than a month ago    Pain Frequency  Intermittent    Aggravating Factors   cold, rainy weather    Pain Relieving Factors  warming up with bike                      [facility] Adult PT Treatment/Exercise - 01/07/17 1022      Ambulation/Gait   Ambulation/Gait  Yes    Ambulation/Gait Assistance  5: Supervision    Ambulation Distance (Feet)  800 Feet then 120    Assistive device  Other (Comment) bilateral walking poles    Gait Pattern  Step-through pattern;Decreased arm swing - right;Decreased arm swing - left;Decreased trunk rotation;Poor foot clearance - left;Poor foot clearance - right    Ambulation Surface  Level;Indoor    Gait velocity  9.10 sec = 3.6 ft/sec  Pre-Gait Activities  Trunk rotation activities in corner with reaching up and across x 10 reps each side, then reaching across body, touching wall, x 10 reps each side.  Progressed to gait activities without walking poles with brief facilitation of shoulders for trunk rotation to aid arm swing.    Gait Comments  Gait training on treadmill, 1.4 mph, with bilateral UE supported, x 5 minutes, cues for upright posture and for heelstrike/increased step length with gait.      Knee/Hip Exercises: Stretches   Active Hamstring Stretch  Right;Left;3 reps;30 seconds    Active Hamstring Stretch Limitations  Sitting, foot propped on floor with cues for upright posture      Knee/Hip Exercises: Aerobic   Stepper  Seated SciFit Stepper, Level 2, 4 extremities, x 9 minutes, cues for RPM >70    Other Aerobic  While on Seated SciFit, PT discussed and printed information on options for community fitness, including community centers or [employer].             PT Education - 01/07/17 1212    Education provided   Yes    Education Details  Discussed and provided information on [facility], other community centers (with possible fitness rooms), and [employer] as options for use of aerobic equipment    Person(s) Educated  Patient    Methods  Explanation;Demonstration;Handout    Comprehension  Verbalized understanding       PT Short Term Goals - 12/29/16 1225      PT SHORT TERM GOAL #1   Title  Pt will be independent with HEP to address Parkinson's specific deficits.  TARGET 12/24/16    Time  4    Period  Weeks    Status  Achieved      PT SHORT TERM GOAL #2   Title  Pt will improve 5x sit<>stand to less than or equal to 13 seconds for improved efficiency and safety with transfers.    Baseline  16.75 sec 12/20/16    Time  4    Period  Weeks    Status  Not Met      PT SHORT TERM GOAL #3   Title  Pt will improve TUG score to less than or equal to 13.5 sec for decreased fall risk.    Baseline  14.56 sec    Time  4    Period  Weeks    Status  Not Met      PT SHORT TERM GOAL #4   Title  Pt will verbalize understanding of local Parkinson's disease-related resources in community.    Time  4    Period  Weeks    Status  Achieved      PT SHORT TERM GOAL #5   Title  Pt will improve Functional Gait Assessment to at least 19/30 for decreased fall risk.    Baseline  22/30 on 12/20/16    Time  4    Period  Weeks    Status  Achieved        PT Long Term Goals - 11/23/16 2027      PT LONG TERM GOAL #1   Title  Pt will verbalize understanding of fall prevention in home environment.  TARGET 01/21/17    Time  8    Period  Weeks    Status  New    Target Date  01/21/17      PT LONG TERM GOAL #2   Title  Pt will perform at least 8 of  10 reps of sit<>stand from <18" surfaces, modified independently, for improved low surface transfers.    Time  8    Period  Weeks    Status  New    Target Date  01/21/17      PT LONG TERM GOAL #3   Title  Pt will improve TUG manual score to less than or equal to 14.5  seconds for decreased fall risk.    Time  8    Period  Weeks    Status  New    Target Date  01/21/17      PT LONG TERM GOAL #4   Title  Pt will improve Functional Gait Assessment score to at least 22/30 for decreased fall risk.    Time  8    Period  Weeks    Status  New    Target Date  01/21/17      PT LONG TERM GOAL #5   Title  Pt will ambulate at least 1000 ft, indoor and outdoor surfaces, independently, for improved community gait.    Time  8    Period  Weeks    Status  New    Target Date  01/21/17      Additional Long Term Goals   Additional Long Term Goals  Yes      PT LONG TERM GOAL #6   Title  Pt will verbalize plans for continued community fitness upon D/C from PT.    Time  8    Period  Weeks    Status  New    Target Date  01/21/17            Plan - 01/16/2017 1214    Clinical Impression Statement  Addressed aerobic component of exercise program with SciFit seated stepper today, with information provided on community options for use of aerobic machines.  Also worked on Personnel officer with treadmill and with walking poles.  Pt continues to note benefit from therapy, with improved gait and posture.  Pt will continue to benefit from skilled PT to address posture, balance and functional strengthening for transfers and gait, working towards [place].    Rehab Potential  Good    PT Frequency  2x / week    PT Duration  8 weeks plus eval    PT Treatment/Interventions  ADLs/Self Care Home Management;Gait training;Neuromuscular re-education;Balance training;Therapeutic exercise;Therapeutic activities;Functional mobility training;Patient/family education    PT Next Visit Plan  SciFit/NuStep for warm-with specific discussion on intensity on machine; follow up to see if patient looked into community fitness options; Review seated and standing PWR! MOves (intensity, discussion on relation to function); pt likes dancing-maybe work on varied direction stepping activities (?Chartered certified accountant)     Consulted and Agree with Plan of Care  Patient       Patient will benefit from skilled therapeutic intervention in order to improve the following deficits and impairments:  Abnormal gait, Decreased balance, Decreased mobility, Decreased strength, Difficulty walking, Impaired tone, Postural dysfunction, Impaired flexibility  Visit Diagnosis: Other symptoms and signs involving the nervous system  Other abnormalities of gait and mobility   G-Codes - 01/16/17 1217    Functional Assessment Tool Used (Outpatient Only)  5x sit<>stand 15.78 sec, TUG 10.16 sec, FGA 27/30 (at best); gait velocity 3.6 ft/sec    Functional Limitation  Mobility: Walking and moving around    Mobility: Walking and Moving Around Current Status ([id number])  At least 20 percent but less than 40 percent impaired, limited or restricted  Mobility: Walking and Moving Around Goal Status [id number])  At least 1 percent but less than 20 percent impaired, limited or restricted       Problem List Patient Active Problem List   Diagnosis Date Noted  . Parkinsonism ([place]) 11/04/2016  . Gait abnormality 11/04/2016  . Hypothyroidism (acquired) 05/16/2013  . Encounter for therapeutic drug monitoring 03/23/2013  . Warfarin anticoagulation 10/01/2012  . Ejection fraction   . Atrial fibrillation ([place]) 08/08/2012  . Thyroid cancer ([place]) 08/27/2011  . Chronic kidney disease   . Hypertension     MARRIOTT,AMY W. 01/07/2017, 12:19 PM  Frazier Butt., PT   [facility] [address] [place], [place], [address] Phone: [phone]   Fax:  [phone]  Name: Randy [name] MRN: [id number] Date of Birth: Sep 29, 1955   Physical Therapy Progress Note  Dates of Reporting Period: 11/23/16 to 01/07/17  Objective Reports of Subjective Statement: notes improved walking  Objective Measurements: TUG 10.16 sec (at best), FGA 27/30 (at best); 3.6 ft/sec gait velocity; 15.78 sec 5x  sit<>stand  Goal Update: Pt has met 4 of 5 short term goals and is progressing towards LTGs.  Plan: Continue skilled PT towards LTGs to address intensity, amplitude of movement patterns.    Reason Skilled Services are Required: Pt seems to benefit from cueing of increased intensity, larger movement patterns, and is often moving better at session's end than beginning of session.  Pt will continue to benefit from skilled PT to further improve carryover of large amplitude, higher intensity movements with his PWR! MOves HEP and gait activities.  Mady Haagensen, PT 01/07/17 12:33 PM Phone: [phone] Fax: [phone]

## 2017-01-10 ENCOUNTER — Ambulatory Visit: Payer: Medicare Other | Admitting: Physical Therapy

## 2017-01-12 ENCOUNTER — Ambulatory Visit: Payer: Medicare Other | Admitting: Physical Therapy

## 2017-01-15 ENCOUNTER — Other Ambulatory Visit: Payer: Self-pay | Admitting: Cardiovascular Disease

## 2017-01-18 ENCOUNTER — Ambulatory Visit: Payer: Medicare Other | Admitting: Physical Therapy

## 2017-01-18 ENCOUNTER — Telehealth: Payer: Self-pay | Admitting: Physical Therapy

## 2017-01-18 NOTE — Telephone Encounter (Signed)
Called patient to inquire about recent missing appointments, including today's 10:15 PT appointment.  Left message as noted above, and reminded patient of next PT appointment at 10:15 on 01/21/17.  Mady Haagensen, PT 01/18/17 10:37 AM Phone: [phone] Fax: [phone]

## 2017-01-20 ENCOUNTER — Ambulatory Visit (INDEPENDENT_AMBULATORY_CARE_PROVIDER_SITE_OTHER): Payer: Medicare Other | Admitting: *Deleted

## 2017-01-20 DIAGNOSIS — Z5181 Encounter for therapeutic drug level monitoring: Secondary | ICD-10-CM | POA: Diagnosis not present

## 2017-01-20 DIAGNOSIS — I4891 Unspecified atrial fibrillation: Secondary | ICD-10-CM

## 2017-01-20 LAB — POCT INR: INR: 1.9

## 2017-01-20 NOTE — Patient Instructions (Signed)
Today take 1.5 tablets, then Continue taking 1 tablet everyday except 1/2 tablet on Wednesdays and Fridays. Recheck in 2 weeks.

## 2017-01-21 ENCOUNTER — Ambulatory Visit: Payer: Medicare Other | Admitting: Physical Therapy

## 2017-01-21 DIAGNOSIS — R2689 Other abnormalities of gait and mobility: Secondary | ICD-10-CM | POA: Diagnosis not present

## 2017-01-21 DIAGNOSIS — R29818 Other symptoms and signs involving the nervous system: Secondary | ICD-10-CM | POA: Diagnosis not present

## 2017-01-21 DIAGNOSIS — R293 Abnormal posture: Secondary | ICD-10-CM | POA: Diagnosis not present

## 2017-01-21 DIAGNOSIS — R2681 Unsteadiness on feet: Secondary | ICD-10-CM | POA: Diagnosis not present

## 2017-01-21 NOTE — Therapy (Signed)
[facility] [address] [place], [place], [address] Phone: [phone]   Fax:  [phone]  Physical Therapy Treatment  Patient Details  Name: Randy [name] MRN: [id number] Date of Birth: [birth year] Referring Provider: Krista Blue   Encounter Date: 01/21/2017  PT End of Session - 01/21/17 2019    Visit Number  11    Number of Visits  19 per recert 13/24/40    Date for PT Re-Evaluation  01/22/17    Authorization Type  Medicare-GCODE every 10th visit    PT Start Time  1028    PT Stop Time  1107    PT Time Calculation (min)  39 min    Activity Tolerance  Patient tolerated treatment well    Behavior During Therapy  [facility] for tasks assessed/performed       Past Medical History:  Diagnosis Date  . Arthritis    knees  . Atrial fibrillation ([place])    dx 07/2012; CHADS2=2; coumadin started  . Chronic kidney disease    dialysis; kidney transplant  2004  . Diabetes ([place])   . Dyslipidemia   . Ejection fraction    a. Echo 7/14:  Mod LVH, EF 55-60%, Gr 2 DD, MAC, mild LAE, PASP 32, trivial Eff.  . Ejection fraction    .  Marland Kitchen History of verruca vulgaris   . Hypercalcemia   . Hyperlipidemia   . Hyperparathyroidism ([place])   . Hypertension   . Multiple thyroid nodules   . PAF (paroxysmal atrial fibrillation) ([place])   . 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 Preop cardiovascular exam    Preop clearance for thyroid surgery June, 2013  . Thyroid cancer ([place])   . Vitamin D deficiency   . Vitiligo     Past Surgical History:  Procedure Laterality Date  . KIDNEY TRANSPLANT  2004  . THYROIDECTOMY  08/17/2011   Procedure: THYROIDECTOMY;  Surgeon: Earnstine Regal, MD;  Location: [facility];  Service: General;  Laterality: N/A;    There were no vitals filed for this visit.  Subjective Assessment - 01/21/17 1031    Subjective  Felt like my blood pressure was dropping when I took the Requip.  I went and laid down.  Talked to Dr. Krista Blue  about it.  She wants me to keep taking it.  I did not take it today.    Pertinent History  DM, A-fib, kidney transplant 2004, HTN    Patient Stated Goals  Pt's goals for therapy are to improve strength in legs.    Currently in Pain?  Yes    Pain Score  2     Pain Location  Knee    Pain Orientation  Right;Left    Pain Descriptors / Indicators  Aching    Pain Type  Chronic pain    Pain Onset  More than a month ago    Pain Frequency  Intermittent    Aggravating Factors   standing up    Pain Relieving Factors  warming up with bike and exercises          [facility] PT Assessment - 01/21/17 1049      Functional Gait  Assessment   Gait assessed   Yes    Gait Level Surface  Walks 20 ft in less than 7 sec but greater than 5.5 sec, uses assistive device, slower speed, mild gait deviations, or deviates 6-10 in outside of the 12 in walkway width.    Change in Gait Speed  Able to smoothly change walking speed without loss of balance or gait deviation. Deviate no more than 6 in outside of the 12 in walkway width.    Gait with Horizontal Head Turns  Performs head turns smoothly with no change in gait. Deviates no more than 6 in outside 12 in walkway width    Gait with Vertical Head Turns  Performs head turns with no change in gait. Deviates no more than 6 in outside 12 in walkway width.    Gait and Pivot Turn  Pivot turns safely within 3 sec and stops quickly with no loss of balance.    Step Over Obstacle  Is able to step over 2 stacked shoe boxes taped together (9 in total height) without changing gait speed. No evidence of imbalance.    Gait with Narrow Base of Support  Is able to ambulate for 10 steps heel to toe with no staggering.    Gait with Eyes Closed  Walks 20 ft, uses assistive device, slower speed, mild gait deviations, deviates 6-10 in outside 12 in walkway width. Ambulates 20 ft in less than 9 sec but greater than 7 sec.    Ambulating Backwards  Walks 20 ft, uses assistive device, slower  speed, mild gait deviations, deviates 6-10 in outside 12 in walkway width.    Steps  Alternating feet, no rail.    Total Score  27        Mini-BESTest: Balance Evaluation Systems Test  2005-2013 [place]. All rights reserved. ________________________________________________________________________________________Anticipatory_________Subscore___5__/6 1. SIT TO STAND Instruction: "Cross your arms across your chest. Try not to use your hands unless you must.Do not let your legs lean against the back of the chair when you stand. Please stand up now." X(2) Normal: Comes to stand without use of hands and stabilizes independently. (1) Moderate: Comes to stand WITH use of hands on first attempt. (0) Severe: Unable to stand up from chair without assistance, OR needs several attempts with use of hands. 2. RISE TO TOES Instruction: "Place your feet shoulder width apart. Place your hands on your hips. Try to rise as high as you can onto your toes. I will count out loud to 3 seconds. Try to hold this pose for at least 3 seconds. Look straight ahead. Rise now." X(2) Normal: Stable for 3 s with maximum height. (1) Moderate: Heels up, but not full range (smaller than when holding hands), OR noticeable instability for 3 s. (0) Severe: < 3 s. 3. STAND ON ONE LEG Instruction: "Look straight ahead. Keep your hands on your hips. Lift your leg off of the ground behind you without touching or resting your raised leg upon your other standing leg. Stay standing on one leg as long as you can. Look straight ahead. Lift now." Left: Time in Seconds Trial 1:__1___Trial 2:__5.25 ___ (2) Normal: 20 s. X(1) Moderate: < 20 s. (0) Severe: Unable. Right: Time in Seconds Trial 1:___4.16__Trial 2:__12.9___ (2) Normal: 20 s. X(1) Moderate: < 20 s. (0) Severe: Unable To score each side separately use the trial with the longest time. To calculate the sub-score and total score use the side [left  or right] with the lowest numerical score [i.e. the worse side]. ______________________________________________________________________________________Reactive Postural Control___________Subscore:__4___/6 4. COMPENSATORY STEPPING CORRECTION- FORWARD Instruction: "Stand with your feet shoulder width apart, arms at your sides. Lean forward against my hands beyond your forward limits. When I let go, do whatever is necessary, including taking a step, to avoid a fall." (2) Normal: Recovers  independently with a single, large step (second realignment step is allowed). X(1) Moderate: More than one step used to recover equilibrium. (0) Severe: No step, OR would fall if not caught, OR falls spontaneously. 5. COMPENSATORY STEPPING CORRECTION- BACKWARD Instruction: "Stand with your feet shoulder width apart, arms at your sides. Lean backward against my hands beyond your backward limits. When I let go, do whatever is necessary, including taking a step, to avoid a fall." (2) Normal: Recovers independently with a single, large step. X(1) Moderate: More than one step used to recover equilibrium. (0) Severe: No step, OR would fall if not caught, OR falls spontaneously. 6. COMPENSATORY STEPPING CORRECTION- LATERAL Instruction: "Stand with your feet together, arms down at your sides. Lean into my hand beyond your sideways limit. When I let go, do whatever is necessary, including taking a step, to avoid a fall." Left X(2) Normal: Recovers independently with 1 step (crossover or lateral OK). (1) Moderate: Several steps to recover equilibrium. (0) Severe: Falls, or cannot step. Right X(2) Normal: Recovers independently with 1 step (crossover or lateral OK). (1) Moderate: Several steps to recover equilibrium. (0) Severe: Falls, or cannot step. Use the side with the lowest score to calculate sub-score and total score. ____________________________________________________________________________________Sensory  Orientation_____________Subscore:______4___/6 7. STANCE (FEET TOGETHER); EYES OPEN, FIRM SURFACE Instruction: "Place your hands on your hips. Place your feet together until almost touching. Look straight ahead. Be as stable and still as possible, until I say stop." Time in seconds:________ X(2) Normal: 30 s. (1) Moderate: < 30 s. (0) Severe: Unable. 8. STANCE (FEET TOGETHER); EYES CLOSED, FOAM SURFACE Instruction: "Step onto the foam. Place your hands on your hips. Place your feet together until almost touching. Be as stable and still as possible, until I say stop. I will start timing when you close your eyes." Time in seconds:________ (2) Normal: 30 s. X(1) Moderate: < 30 s. (0) Severe: Unable. 9. INCLINE- EYES CLOSED Instruction: "Step onto the incline ramp. Please stand on the incline ramp with your toes toward the top. Place your feet shoulder width apart and have your arms down at your sides. I will start timing when you close your eyes." Time in seconds:________ (2) Normal: Stands independently 30 s and aligns with gravity. X(1) Moderate: Stands independently <30 s OR aligns with surface. (0) Severe: Unable. _________________________________________________________________________________________Dynamic Gait ______Subscore____9____/10 10. CHANGE IN GAIT SPEED Instruction: "Begin walking at your normal speed, when I tell you 'fast', walk as fast as you can. When I say 'slow', walk very slowly." X(2) Normal: Significantly changes walking speed without imbalance. (1) Moderate: Unable to change walking speed or signs of imbalance. (0) Severe: Unable to achieve significant change in walking speed AND signs of imbalance. [place] - HORIZONTAL Instruction: "Begin walking at your normal speed, when I say "right", turn your head and look to the right. When I say "left" turn your head and look to the left. Try to keep yourself walking in a straight line." X(2) Normal:  performs head turns with no change in gait speed and good balance. (1) Moderate: performs head turns with reduction in gait speed. (0) Severe: performs head turns with imbalance. 12. WALK WITH PIVOT TURNS Instruction: "Begin walking at your normal speed. When I tell you to 'turn and stop', turn as quickly as you can, face the opposite direction, and stop. After the turn, your feet should be close together." (2) Normal: Turns with feet close FAST (< 3 steps) with good balance. X(1) Moderate: Turns with  feet close SLOW (>4 steps) with good balance. (0) Severe: Cannot turn with feet close at any speed without imbalance. 13. STEP OVER OBSTACLES Instruction: "Begin walking at your normal speed. When you get to the box, step over it, not around it and keep walking." X(2) Normal: Able to step over box with minimal change of gait speed and with good balance. (1) Moderate: Steps over box but touches box OR displays cautious behavior by slowing gait. (0) Severe: Unable to step over box OR steps around box. 14. TIMED UP & GO WITH DUAL TASK [3 METER WALK] Instruction TUG: "When I say 'Go', stand up from chair, walk at your normal speed across the tape on the floor, turn around, and come back to sit in the chair." Instruction TUG with Dual Task: "Count backwards by threes starting at ___. When I say 'Go', stand up from chair, walk at your normal speed across the tape on the floor, turn around, and come back to sit in the chair. Continue counting backwards the entire time." TUG: ________seconds; Dual Task TUG: ________seconds X(2) Normal: No noticeable change in sitting, standing or walking while backward counting when compared to TUG without Dual Task. (1) Moderate: Dual Task affects either counting OR walking (>10%) when compared to the TUG without Dual Task. (0) Severe: Stops counting while walking OR stops walking while counting. When scoring item 14, if subject's gait speed slows more than 10%  between the TUG without and with a Dual Task the score should be decreased by a point. TOTAL SCORE: ____22____/28            [facility] Adult PT Treatment/Exercise - 01/21/17 0001      Transfers   Transfers  Sit to Stand;Stand to Sit    Sit to Stand  7: Independent;Without upper extremity assist;From chair/3-in-1    Five time sit to stand comments   16.19    Stand to Sit  7: Independent;Without upper extremity assist;To chair/3-in-1    Comments  Attempted sit<>stand from 17" surface, no UE support, patient able to complete 3 and gets "stuck" on 4th rep.  Discussed, practice use of hands to "boost" up into standing to avoid getting stuck in middle and avoid increased pain in knees.      Standardized Balance Assessment   Standardized Balance Assessment  Timed Up and Go Test      Timed Up and Go Test   TUG  Normal TUG;Manual TUG;Cognitive TUG    Normal TUG (seconds)  12.66    Manual TUG (seconds)  12.97    Cognitive TUG (seconds)  12.29      High Level Balance   High Level Balance Comments  MiniBESTest score:  22/28 (difficulty with SLS, step strategy forward and back; compliant surface EC)      Self-Care   Self-Care  Other Self-Care Comments    Other Self-Care Comments   Discussed POC and progress towards goals.  Pt has missed several visits due to not feeling well with new medication; pt feels strongly about continueing therapy to address remaining/ongoing goals for several weeks and then transition to d/c.  Discussed new POC and goals.               PT Short Term Goals - 12/29/16 1225      PT SHORT TERM GOAL #1   Title  Pt will be independent with HEP to address Parkinson's specific deficits.  TARGET 12/24/16    Time  4    Period  Weeks    Status  Achieved      PT SHORT TERM GOAL #2   Title  Pt will improve 5x sit<>stand to less than or equal to 13 seconds for improved efficiency and safety with transfers.    Baseline  16.75 sec 12/20/16    Time  4    Period   Weeks    Status  Not Met      PT SHORT TERM GOAL #3   Title  Pt will improve TUG score to less than or equal to 13.5 sec for decreased fall risk.    Baseline  14.56 sec    Time  4    Period  Weeks    Status  Not Met      PT SHORT TERM GOAL #4   Title  Pt will verbalize understanding of local Parkinson's disease-related resources in community.    Time  4    Period  Weeks    Status  Achieved      PT SHORT TERM GOAL #5   Title  Pt will improve Functional Gait Assessment to at least 19/30 for decreased fall risk.    Baseline  22/30 on 12/20/16    Time  4    Period  Weeks    Status  Achieved        PT Long Term Goals - 01/21/17 1041      PT LONG TERM GOAL #1   Title  Pt will verbalize understanding of fall prevention in home environment.  TARGET 01/21/17  ONGOING GOAL, updated TARGET 02/18/17    Time  8 ongoing goal= 4 weeks per recert 19/37/90    Period  Weeks    Status  On-going    Target Date  02/18/17      PT LONG TERM GOAL #2   Title  Pt will perform at least 8 of 10 reps of sit<>stand from <18" surfaces, modified independently, for improved low surface transfers.  ONGOING GOAL, UPDATED TARGET 02/18/17    Time  8 4 weeks per recert 24/09/73    Period  Weeks    Status  Not Met    Target Date  02/18/17      PT LONG TERM GOAL #3   Title  Pt will improve TUG manual score to less than or equal to 14.5 seconds for decreased fall risk.    Time  8    Period  Weeks    Status  Achieved    Target Date  02/18/17      PT LONG TERM GOAL #4   Title  Pt will improve Functional Gait Assessment score to at least 22/30 for decreased fall risk.    Baseline  27/30    Time  8    Period  Weeks    Status  Achieved      PT LONG TERM GOAL #5   Title  Pt will ambulate at least 1000 ft, indoor and outdoor surfaces, independently, for improved community gait.  UPDATED TARGET 02/18/17    Time  8 4 weeks, per recert 53/29/92    Period  Weeks    Status  On-going    Target Date  02/18/17       Additional Long Term Goals   Additional Long Term Goals  Yes      PT LONG TERM GOAL #6   Title  Pt will verbalize plans for continued community fitness upon D/C from PT.    Time  8  Period  Weeks    Status  Achieved      PT LONG TERM GOAL #7   Title  Pt will improve MiniBESTest score to at least 24/28 for improved balance strategy and compliant surface balance.  TARGET 02/18/17    Time  4    Period  Weeks    Status  New    Target Date  02/18/17            Plan - 01/21/17 2020    Clinical Impression Statement  Pt has missed several visits/weeks of therapy due to holiday and due to not feeling well with addition of new medication.  Assessed LTGs today, with pt meeting LTG 1, 2, 5.  LTG 3, 4, 6 not met/ongoing and LTG 7 is new.  Pt has made progress with PT, but he does demonstrate balance difficulties with stepping strategies and with compliant surfaces on MiniBESTest.  Pt would benefit from skilled PT to address high level balance and gait activities.    Rehab Potential  Good    PT Frequency  2x / week    PT Duration  4 weeks per recert 34/28/76    PT Treatment/Interventions  ADLs/Self Care Home Management;Gait training;Neuromuscular re-education;Balance training;Therapeutic exercise;Therapeutic activities;Functional mobility training;Patient/family education    PT Next Visit Plan  Review seated and standing PWR! Moves-work on step strategy and compliant surface/SLS balance activities.  Pt likes dancing-maybe work on varied direction stepping activities (?Chartered certified accountant)    Consulted and Agree with Plan of Care  Patient       Patient will benefit from skilled therapeutic intervention in order to improve the following deficits and impairments:  Abnormal gait, Decreased balance, Decreased mobility, Decreased strength, Difficulty walking, Impaired tone, Postural dysfunction, Impaired flexibility  Visit Diagnosis: Other symptoms and signs involving the nervous system  Other  abnormalities of gait and mobility  Unsteadiness on feet  Abnormal posture     Problem List Patient Active Problem List   Diagnosis Date Noted  . Parkinsonism ([place]) 11/04/2016  . Gait abnormality 11/04/2016  . Hypothyroidism (acquired) 05/16/2013  . Encounter for therapeutic drug monitoring 03/23/2013  . Warfarin anticoagulation 10/01/2012  . Ejection fraction   . Atrial fibrillation ([place]) 08/08/2012  . Thyroid cancer ([place]) 08/27/2011  . Chronic kidney disease   . Hypertension     MARRIOTT,AMY W. 01/21/2017, 8:25 PM  Frazier Butt., PT   [facility] [address] [place], [place], [address] Phone: [phone]   Fax:  [phone]  Name: Randy [name] MRN: [id number] Date of Birth: May 28, 1955

## 2017-01-26 ENCOUNTER — Ambulatory Visit: Payer: Medicare Other | Attending: Neurology | Admitting: Physical Therapy

## 2017-01-26 ENCOUNTER — Telehealth: Payer: Self-pay | Admitting: *Deleted

## 2017-01-26 DIAGNOSIS — R29818 Other symptoms and signs involving the nervous system: Secondary | ICD-10-CM | POA: Diagnosis not present

## 2017-01-26 DIAGNOSIS — R2681 Unsteadiness on feet: Secondary | ICD-10-CM | POA: Diagnosis not present

## 2017-01-26 DIAGNOSIS — R2689 Other abnormalities of gait and mobility: Secondary | ICD-10-CM | POA: Diagnosis not present

## 2017-01-26 DIAGNOSIS — R293 Abnormal posture: Secondary | ICD-10-CM | POA: Insufficient documentation

## 2017-01-26 NOTE — Telephone Encounter (Signed)
Patient stopped by the office to report that the second medication Dr. Krista Blue prescribed is making him dizzy.  Please call.

## 2017-01-26 NOTE — Telephone Encounter (Addendum)
Patient's insurance would not cover Requip XR and his current prescription is for immediate release Requip 1mg , TID.  Says he is unable to tolerate the medication due to extreme dizziness.  He will sometimes try a dose at bedtime but never during the day.  More often than not, he does not take the mediation at all.  He feels Sinemet 25-100, one tablet TID is helpful.  He is also involved with physical therapy and says it has been beneficial.  Per vo by Dr. Krista Blue, ok to stop Requip and just continue Sinemet.  He is agreeable to this plan.  He will call back with any other concerns.

## 2017-01-26 NOTE — Therapy (Signed)
[facility] [address] [place], [place], [address] Phone: [phone]   Fax:  [phone]  Physical Therapy Treatment  Patient Details  Name: Randy [name] MRN: [id number] Date of Birth: May 27, 1955 Referring Provider: Krista Blue   Encounter Date: 01/26/2017  PT End of Session - 01/26/17 1208    Visit Number  12    Number of Visits  19 per recert 51/02/58    Date for PT Re-Evaluation  01/22/17    Authorization Type  Medicare-GCODE every 10th visit    PT Start Time  0930    PT Stop Time  1015    PT Time Calculation (min)  45 min    Equipment Utilized During Treatment  Gait belt    Activity Tolerance  Patient tolerated treatment well    Behavior During Therapy  WFL for tasks assessed/performed       Past Medical History:  Diagnosis Date  . Arthritis    knees  . Atrial fibrillation ([place])    dx 07/2012; CHADS2=2; coumadin started  . Chronic kidney disease    dialysis; kidney transplant  2004  . Diabetes ([place])   . Dyslipidemia   . Ejection fraction    a. Echo 7/14:  Mod LVH, EF 55-60%, Gr 2 DD, MAC, mild LAE, PASP 32, trivial Eff.  . Ejection fraction    .  Marland Kitchen History of verruca vulgaris   . Hypercalcemia   . Hyperlipidemia   . Hyperparathyroidism ([place])   . Hypertension   . Multiple thyroid nodules   . PAF (paroxysmal atrial fibrillation) ([place])   . 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 Preop cardiovascular exam    Preop clearance for thyroid surgery June, 2013  . Thyroid cancer ([place])   . Vitamin D deficiency   . Vitiligo     Past Surgical History:  Procedure Laterality Date  . KIDNEY TRANSPLANT  2004  . THYROIDECTOMY  08/17/2011   Procedure: THYROIDECTOMY;  Surgeon: Earnstine Regal, MD;  Location: [facility];  Service: General;  Laterality: N/A;    There were no vitals filed for this visit.  Subjective Assessment - 01/26/17 0934    Subjective  Continues to feel like his blood pressure was dropping when  he  takes  the Requip. (Correcton 12/5), actually did not  Talked to Dr. Krista Blue about issues with new med. Pt will not take new med  before therapy because he feels like it will make him imbalanced and it makes him feel like he needs to lie down. Has not taken Requip for a week..    Pertinent History  DM, A-fib, kidney transplant 2004, HTN    Patient Stated Goals  Pt's goals for therapy are to improve strength in legs.    Currently in Pain?  Yes    Pain Score  3     Pain Location  Knee    Pain Orientation  Right;Left    Pain Descriptors / Indicators  Aching    Pain Onset  More than a month ago    Pain Frequency  Intermittent                         PWR [facility]) - 01/26/17 1212 STANDING   PWR! Up  x10    PWR! Rock  x10    PWR! Twist  x10    PWR Step  x10    Comments  required min cues for technique and posture  Balance Exercises - 01/26/17 1002      Balance Exercises: Standing   Standing Eyes Opened  Narrow base of support (BOS);Head turns;Foam/compliant surface Required intermittent UE support    Stepping Strategy  Anterior;Posterior;Foam/compliant surface;UE support        PT Education - 01/26/17 1205    Education provided  Yes    Education Details  Recommended Pt follow up with MD about concerns with new med. Instructed pt on benefits of performing standing PWR! Moves more often and how he can incorporate it in his routine.  Benefits of balance training on compliant surfaces.    Person(s) Educated  Patient;Other (comment) sister who assist him most days of the week.    Methods  Explanation;Demonstration;Verbal cues    Comprehension  Verbalized understanding;Returned demonstration;Verbal cues required;Tactile cues required;Need further instruction       PT Short Term Goals - 12/29/16 1225      PT SHORT TERM GOAL #1   Title  Pt will be independent with HEP to address Parkinson's specific deficits.  TARGET 12/24/16    Time  4    Period  Weeks    Status   Achieved      PT SHORT TERM GOAL #2   Title  Pt will improve 5x sit<>stand to less than or equal to 13 seconds for improved efficiency and safety with transfers.    Baseline  16.75 sec 12/20/16    Time  4    Period  Weeks    Status  Not Met      PT SHORT TERM GOAL #3   Title  Pt will improve TUG score to less than or equal to 13.5 sec for decreased fall risk.    Baseline  14.56 sec    Time  4    Period  Weeks    Status  Not Met      PT SHORT TERM GOAL #4   Title  Pt will verbalize understanding of local Parkinson's disease-related resources in community.    Time  4    Period  Weeks    Status  Achieved      PT SHORT TERM GOAL #5   Title  Pt will improve Functional Gait Assessment to at least 19/30 for decreased fall risk.    Baseline  22/30 on 12/20/16    Time  4    Period  Weeks    Status  Achieved        PT Long Term Goals - 01/21/17 1041      PT LONG TERM GOAL #1   Title  Pt will verbalize understanding of fall prevention in home environment.  TARGET 01/21/17  ONGOING GOAL, updated TARGET 02/18/17    Time  8 ongoing goal= 4 weeks per recert 01/74/94    Period  Weeks    Status  On-going    Target Date  02/18/17      PT LONG TERM GOAL #2   Title  Pt will perform at least 8 of 10 reps of sit<>stand from <18" surfaces, modified independently, for improved low surface transfers.  ONGOING GOAL, UPDATED TARGET 02/18/17    Time  8 4 weeks per recert 49/67/59    Period  Weeks    Status  Not Met    Target Date  02/18/17      PT LONG TERM GOAL #3   Title  Pt will improve TUG manual score to less than or equal to 14.5 seconds for decreased fall risk.  Time  8    Period  Weeks    Status  Achieved    Target Date  02/18/17      PT LONG TERM GOAL #4   Title  Pt will improve Functional Gait Assessment score to at least 22/30 for decreased fall risk.    Baseline  27/30    Time  8    Period  Weeks    Status  Achieved      PT LONG TERM GOAL #5   Title  Pt will ambulate  at least 1000 ft, indoor and outdoor surfaces, independently, for improved community gait.  UPDATED TARGET 02/18/17    Time  8 4 weeks, per recert 13/08/65    Period  Weeks    Status  On-going    Target Date  02/18/17      Additional Long Term Goals   Additional Long Term Goals  Yes      PT LONG TERM GOAL #6   Title  Pt will verbalize plans for continued community fitness upon D/C from PT.    Time  8    Period  Weeks    Status  Achieved      PT LONG TERM GOAL #7   Title  Pt will improve MiniBESTest score to at least 24/28 for improved balance strategy and compliant surface balance.  TARGET 02/18/17    Time  4    Period  Weeks    Status  New    Target Date  02/18/17            Plan - 01/26/17 1208    Clinical Impression Statement  Pt's sister who assist him during the week was present for session.  Sister plans to help pt follow up with MD about new medication issues.  Worked on standing balance and stepping strategies on compliant surface; pt required intermittent UE suppport and supervision due to imbalance.                          Rehab Potential  Good    PT Frequency  2x / week    PT Duration  4 weeks per recert 78/46/96    PT Treatment/Interventions  ADLs/Self Care Home Management;Gait training;Neuromuscular re-education;Balance training;Therapeutic exercise;Therapeutic activities;Functional mobility training;Patient/family education    PT Next Visit Plan   Moves-work on step strategy and compliant surface/SLS balance activities.  Pt likes dancing-maybe work on varied direction stepping activities (?Chartered certified accountant)    Consulted and Agree with Plan of Care  Patient       Patient will benefit from skilled therapeutic intervention in order to improve the following deficits and impairments:  Abnormal gait, Decreased balance, Decreased mobility, Decreased strength, Difficulty walking, Impaired tone, Postural dysfunction, Impaired flexibility  Visit Diagnosis: Other symptoms  and signs involving the nervous system  Other abnormalities of gait and mobility  Unsteadiness on feet  Abnormal posture     Problem List Patient Active Problem List   Diagnosis Date Noted  . Parkinsonism ([place]) 11/04/2016  . Gait abnormality 11/04/2016  . Hypothyroidism (acquired) 05/16/2013  . Encounter for therapeutic drug monitoring 03/23/2013  . Warfarin anticoagulation 10/01/2012  . Ejection fraction   . Atrial fibrillation ([place]) 08/08/2012  . Thyroid cancer ([place]) 08/27/2011  . Chronic kidney disease   . Hypertension     Bjorn Loser, PTA  01/26/17, 12:15 PM [facility] [address] [place], [place], [address] Phone: [phone]  Fax:  [phone]  Name: Randy [name] MRN: [id number] Date of Birth: 09-29-1955

## 2017-01-31 ENCOUNTER — Ambulatory Visit: Payer: Medicare Other | Admitting: Physical Therapy

## 2017-02-01 ENCOUNTER — Ambulatory Visit: Payer: Medicare Other | Admitting: Physical Therapy

## 2017-02-03 ENCOUNTER — Ambulatory Visit (INDEPENDENT_AMBULATORY_CARE_PROVIDER_SITE_OTHER): Payer: Medicare Other | Admitting: *Deleted

## 2017-02-03 DIAGNOSIS — I4891 Unspecified atrial fibrillation: Secondary | ICD-10-CM | POA: Diagnosis not present

## 2017-02-03 DIAGNOSIS — Z5181 Encounter for therapeutic drug level monitoring: Secondary | ICD-10-CM | POA: Diagnosis not present

## 2017-02-03 LAB — POCT INR: INR: 3.2

## 2017-02-03 NOTE — Patient Instructions (Signed)
Description   Skip today's dose, then Continue taking 1 tablet everyday except 1/2 tablet on Wednesdays and Fridays. Start eating 1 large serving each week of leafy green vegetable. Recheck in 2 weeks. Call us with any medication changes or concerns [phone] [facility], Main # [phone].

## 2017-02-09 ENCOUNTER — Ambulatory Visit: Payer: Medicare Other | Admitting: Physical Therapy

## 2017-02-17 ENCOUNTER — Ambulatory Visit (INDEPENDENT_AMBULATORY_CARE_PROVIDER_SITE_OTHER): Payer: Medicare Other | Admitting: *Deleted

## 2017-02-17 DIAGNOSIS — Z5181 Encounter for therapeutic drug level monitoring: Secondary | ICD-10-CM

## 2017-02-17 DIAGNOSIS — I4891 Unspecified atrial fibrillation: Secondary | ICD-10-CM

## 2017-02-17 LAB — POCT INR: INR: 2.2

## 2017-02-17 NOTE — Patient Instructions (Addendum)
Description   Continue taking 1 tablet everyday except 1/2 tablet on Wednesdays and Fridays. Continue eating 1 large serving each week of leafy green vegetable. Recheck in 3 weeks. Call us with any medication changes or concerns [phone] [facility], Main # [phone].

## 2017-02-20 ENCOUNTER — Other Ambulatory Visit: Payer: Self-pay | Admitting: Cardiovascular Disease

## 2017-02-25 DIAGNOSIS — E139 Other specified diabetes mellitus without complications: Secondary | ICD-10-CM | POA: Diagnosis not present

## 2017-02-25 DIAGNOSIS — N2581 Secondary hyperparathyroidism of renal origin: Secondary | ICD-10-CM | POA: Diagnosis not present

## 2017-02-25 DIAGNOSIS — Z85850 Personal history of malignant neoplasm of thyroid: Secondary | ICD-10-CM | POA: Diagnosis not present

## 2017-02-25 DIAGNOSIS — Z940 Kidney transplant status: Secondary | ICD-10-CM | POA: Diagnosis not present

## 2017-02-25 DIAGNOSIS — I480 Paroxysmal atrial fibrillation: Secondary | ICD-10-CM | POA: Diagnosis not present

## 2017-02-25 DIAGNOSIS — E785 Hyperlipidemia, unspecified: Secondary | ICD-10-CM | POA: Diagnosis not present

## 2017-02-25 DIAGNOSIS — I10 Essential (primary) hypertension: Secondary | ICD-10-CM | POA: Diagnosis not present

## 2017-02-25 DIAGNOSIS — G20 Parkinson's disease: Secondary | ICD-10-CM | POA: Diagnosis not present

## 2017-03-03 ENCOUNTER — Other Ambulatory Visit (HOSPITAL_COMMUNITY): Payer: Self-pay | Admitting: Nephrology

## 2017-03-04 DIAGNOSIS — E890 Postprocedural hypothyroidism: Secondary | ICD-10-CM | POA: Diagnosis not present

## 2017-03-04 DIAGNOSIS — E1165 Type 2 diabetes mellitus with hyperglycemia: Secondary | ICD-10-CM | POA: Diagnosis not present

## 2017-03-04 DIAGNOSIS — C73 Malignant neoplasm of thyroid gland: Secondary | ICD-10-CM | POA: Diagnosis not present

## 2017-03-04 DIAGNOSIS — I10 Essential (primary) hypertension: Secondary | ICD-10-CM | POA: Diagnosis not present

## 2017-03-10 ENCOUNTER — Ambulatory Visit (INDEPENDENT_AMBULATORY_CARE_PROVIDER_SITE_OTHER): Payer: Medicare Other

## 2017-03-10 DIAGNOSIS — I4891 Unspecified atrial fibrillation: Secondary | ICD-10-CM | POA: Diagnosis not present

## 2017-03-10 DIAGNOSIS — Z5181 Encounter for therapeutic drug level monitoring: Secondary | ICD-10-CM

## 2017-03-10 LAB — POCT INR: INR: 2.4

## 2017-03-10 NOTE — Patient Instructions (Signed)
Description   Continue taking 1 tablet everyday except 1/2 tablet on Wednesdays and Fridays.  Recheck in 4 weeks. Call us with any medication changes or concerns [phone] [facility], Main # [phone].

## 2017-03-16 NOTE — Progress Notes (Deleted)
Patient ID: Randy [name], male   DOB: 14-Feb-1956, 62 y.o.   MRN: [id number]    HPI  62 y.o. previously seen by Dr Ron Parker with PAF    This patients CHA2DS2-VASc Score and unadjusted Ischemic Stroke Rate (% per year) is equal to 2.2 % stroke rate/year from a score of 2  Above score calculated as 1 point each if present [CHF, HTN, DM, Vascular=MI/PAD/Aortic Plaque, Age if 65-74, or Male] Above score calculated as 2 points each if present [Age > 75, or Stroke/TIA/TE]  Diagnosed June 2014  Converted spontaneously    Echo 08/23/12 reviewed:  Study Conclusions  - Left ventricle: The cavity size was normal. Wall thickness was increased in a pattern of moderate LVH. Systolic function was normal. The estimated ejection fraction was in the range of 55% to 60%. Wall motion was normal; there were no regional wall motion abnormalities. Features are consistent with a pseudonormal left ventricular filling pattern, with concomitant abnormal relaxation and increased filling pressure (grade 2 diastolic dysfunction). - Mitral valve: Calcified annulus. - Left atrium: The atrium was mildly dilated. - Pulmonary arteries: PA peak pressure: 76m Hg (S). - Pericardium, extracardiac: A trivial pericardial effusion was identified.  [facility]  Thyroid cancer followed by Balin  Held coumadin with no bridge for colonoscopy done 07/07/16 had 4 polyps removed by Dr AEnis Gash  ***  No Known Allergies  Current Outpatient Medications  Medication Sig Dispense Refill  . amLODipine (NORVASC) 10 MG tablet Take 10 mg by mouth 2 (two) times daily.    .Marland Kitchenatorvastatin (LIPITOR) 80 MG tablet Take 80 mg by mouth daily.    . carbidopa-levodopa (SINEMET IR) 25-100 MG tablet Take 1 tablet 3 (three) times daily by mouth. 270 tablet 4  . cloNIDine (CATAPRES) 0.3 MG tablet Take 0.3 mg by mouth 2 (two) times daily.    . cycloSPORINE modified (NEORAL) 100 MG capsule Take 125 mg by  mouth 2 (two) times daily.     . enalapril (VASOTEC) 5 MG tablet Take 5 mg by mouth at bedtime.     . furosemide (LASIX) 40 MG tablet Take 40 mg by mouth daily.    .Marland KitchenglipiZIDE (GLUCOTROL XL) 5 MG 24 hr tablet Take 5 mg by mouth daily.    .Marland Kitchenlevothyroxine (SYNTHROID, LEVOTHROID) 137 MCG tablet Take 137 mcg by mouth daily before breakfast.    . metoprolol succinate (TOPROL-XL) 50 MG 24 hr tablet Take 1 tablet (50 mg total) by mouth daily. Take with or immediately following a meal. 90 tablet 3  . mycophenolate (MYFORTIC) 360 MG TBEC Take 720 mg by mouth 2 (two) times daily.     . predniSONE (DELTASONE) 5 MG tablet Take 5 mg by mouth daily.    .Marland Kitchenwarfarin (COUMADIN) 5 MG tablet USE AS DIRECTED BY  COUMADIN  CLINIC 30 tablet 1   No current facility-administered medications for this visit.     Social History   Socioeconomic History  . Marital status: Single    Spouse name: Not on file  . Number of children: 2  . Years of education: HS  . Highest education level: Not on file  Social Needs  . Financial resource strain: Not on file  . Food insecurity - worry: Not on file  . Food insecurity - inability: Not on file  . Transportation needs - medical: Not on file  . Transportation needs - non-medical: Not on file  Occupational History  . Occupation: Disabled  Tobacco Use  . Smoking status: Never Smoker  . Smokeless tobacco: Never Used  Substance and Sexual Activity  . Alcohol use: No    Comment: no use since 2013  . Drug use: No    Comment: previous use - not since 1993  . Sexual activity: Not on file  Other Topics Concern  . Not on file  Social History Narrative   Lives at home alone.   Right-handed.   12 ounces of caffeine weekly.    Family History  Problem Relation Age of Onset  . Dementia Mother   . Diabetes Mother   . Stroke Father   . Diabetes Father     Past Medical History:  Diagnosis Date  . Arthritis    knees  . Atrial fibrillation ([place])    dx 07/2012; CHADS2=2;  coumadin started  . Chronic kidney disease    dialysis; kidney transplant  2004  . Diabetes ([place])   . Dyslipidemia   . Ejection fraction    a. Echo 7/14:  Mod LVH, EF 55-60%, Gr 2 DD, MAC, mild LAE, PASP 32, trivial Eff.  . Ejection fraction    .  Marland Kitchen History of verruca vulgaris   . Hypercalcemia   . Hyperlipidemia   . Hyperparathyroidism ([place])   . Hypertension   . Multiple thyroid nodules   . PAF (paroxysmal atrial fibrillation) ([place])   . 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 Preop cardiovascular exam    Preop clearance for thyroid surgery June, 2013  . Thyroid cancer ([place])   . Vitamin D deficiency   . Vitiligo     Past Surgical History:  Procedure Laterality Date  . KIDNEY TRANSPLANT  2004  . THYROIDECTOMY  08/17/2011   Procedure: THYROIDECTOMY;  Surgeon: Earnstine Regal, MD;  Location: [facility];  Service: General;  Laterality: N/A;    Patient Active Problem List   Diagnosis Date Noted  . Parkinsonism ([place]) 11/04/2016  . Gait abnormality 11/04/2016  . Hypothyroidism (acquired) 05/16/2013  . Encounter for therapeutic drug monitoring 03/23/2013  . Warfarin anticoagulation 10/01/2012  . Ejection fraction   . Atrial fibrillation ([place]) 08/08/2012  . Thyroid cancer ([place]) 08/27/2011  . Chronic kidney disease   . Hypertension     ROS   Patient denies fever, chills, headache, sweats, rash, change in vision, change in hearing, chest pain, cough, nausea vomiting, urinary symptoms. All other systems are reviewed and are negative.  PHYSICAL EXAM There were no vitals taken for this visit. Affect appropriate Healthy:  appears stated age 55: normal Neck supple with no adenopathy JVP normal no bruits no thyromegaly Lungs clear with no wheezing and good diaphragmatic motion Heart:  S1/S2 no murmur, no rub, gallop or click PMI normal Abdomen: benighn, BS positve, no tenderness, no AAA transplant kidney LLQ no bruit.  No HSM or HJR Distal pulses intact with no bruits No  edema Neuro non-focal Skin warm and dry No muscular weakness    ECG:  05/23/13  SR rate 60 LVH   03/17/15  SB rate 51  LVH nonspecific lateral T wave changes   There were no vitals filed for this visit.   ASSESSMENT & PLAN CRF:  Post transplant f/u [place] kidney  Cr 1.38-1.8 stable Thyroid: post surgery for cancer on synthroid replacement follow Calcium PAF:  maint NSR f/u coumadin clinic no bleeding issues  HTN:  Well controlled.  Continue current medications and low sodium Dash type diet.   DM:  Discussed low carb diet.  Target hemoglobin A1c is 6.5 or less.  Continue current medications. GI:  Post polyp removal 07/07/16 pathology benign f/u GI  F.u with me in a year  Jenkins Rouge

## 2017-03-18 ENCOUNTER — Ambulatory Visit: Payer: Medicare Other | Admitting: Cardiovascular Disease

## 2017-03-31 NOTE — Progress Notes (Signed)
Patient ID: Randy [name], male   DOB: December 23, 1955, 62 y.o.   MRN: [id number]    HPI  61 y.o. previously seen by Dr Ron Parker with PAF    This patients CHA2DS2-VASc Score and unadjusted Ischemic Stroke Rate (% per year) is equal to 2.2 % stroke rate/year from a score of 2  Above score calculated as 1 point each if present [CHF, HTN, DM, Vascular=MI/PAD/Aortic Plaque, Age if 65-74, or Male] Above score calculated as 2 points each if present [Age > 75, or Stroke/TIA/TE]  Diagnosed June 2014  Converted spontaneously    Echo 08/23/12 reviewed:  Study Conclusions  - Left ventricle: The cavity size was normal. Wall thickness was increased in a pattern of moderate LVH. Systolic function was normal. The estimated ejection fraction was in the range of 55% to 60%. Wall motion was normal; there were no regional wall motion abnormalities. Features are consistent with a pseudonormal left ventricular filling pattern, with concomitant abnormal relaxation and increased filling pressure (grade 2 diastolic dysfunction). - Mitral valve: Calcified annulus. - Left atrium: The atrium was mildly dilated. - Pulmonary arteries: PA peak pressure: 39m Hg (S). - Pericardium, extracardiac: A trivial pericardial effusion was identified.  [facility]  Thyroid cancer followed by Balin  Held coumadin with no bridge for colonoscopy done 07/07/16 had 4 polyps removed by Dr AEnis Gash  He has Parkinson's now and is seeing [facility] neuro. No falls Can't take NOAC due to renal issues ECG read by computer as Afib but this is due to tremor he is clearly in NSR by auscultation   No Known Allergies  Current Outpatient Medications  Medication Sig Dispense Refill  . amLODipine (NORVASC) 10 MG tablet Take 10 mg by mouth 2 (two) times daily.    .Marland Kitchenatorvastatin (LIPITOR) 80 MG tablet Take 80 mg by mouth daily.    . carbidopa-levodopa (SINEMET IR) 25-100 MG tablet Take 1 tablet 3  (three) times daily by mouth. 270 tablet 4  . cloNIDine (CATAPRES) 0.3 MG tablet Take 0.3 mg by mouth 2 (two) times daily.    . cycloSPORINE modified (NEORAL) 100 MG capsule Take 125 mg by mouth 2 (two) times daily.     . enalapril (VASOTEC) 5 MG tablet Take 5 mg by mouth at bedtime.     . furosemide (LASIX) 40 MG tablet Take 40 mg by mouth daily.    .Marland KitchenglipiZIDE (GLUCOTROL XL) 5 MG 24 hr tablet Take 5 mg by mouth daily.    .Marland Kitchenlevothyroxine (SYNTHROID, LEVOTHROID) 125 MCG tablet Take 1 tablet by mouth daily.    .Marland Kitchenlevothyroxine (SYNTHROID, LEVOTHROID) 137 MCG tablet Take 137 mcg by mouth daily before breakfast.    . metoprolol succinate (TOPROL-XL) 50 MG 24 hr tablet Take 1 tablet (50 mg total) by mouth daily. Take with or immediately following a meal. 90 tablet 3  . mycophenolate (MYFORTIC) 360 MG TBEC Take 720 mg by mouth 2 (two) times daily.     . predniSONE (DELTASONE) 5 MG tablet Take 5 mg by mouth daily.    .Marland Kitchenwarfarin (COUMADIN) 5 MG tablet USE AS DIRECTED BY  COUMADIN  CLINIC 30 tablet 1   No current facility-administered medications for this visit.     Social History   Socioeconomic History  . Marital status: Single    Spouse name: Not on file  . Number of children: 2  . Years of education: HS  . Highest education level: Not on file  Social  Needs  . Financial resource strain: Not on file  . Food insecurity - worry: Not on file  . Food insecurity - inability: Not on file  . Transportation needs - medical: Not on file  . Transportation needs - non-medical: Not on file  Occupational History  . Occupation: Disabled  Tobacco Use  . Smoking status: Never Smoker  . Smokeless tobacco: Never Used  Substance and Sexual Activity  . Alcohol use: No    Comment: no use since 2013  . Drug use: No    Comment: previous use - not since 1993  . Sexual activity: Not on file  Other Topics Concern  . Not on file  Social History Narrative   Lives at home alone.   Right-handed.   12 ounces  of caffeine weekly.    Family History  Problem Relation Age of Onset  . Dementia Mother   . Diabetes Mother   . Stroke Father   . Diabetes Father     Past Medical History:  Diagnosis Date  . Arthritis    knees  . Atrial fibrillation ([employer])    dx 07/2012; CHADS2=2; coumadin started  . Chronic kidney disease    dialysis; kidney transplant  2004  . Diabetes ([place])   . Dyslipidemia   . Ejection fraction    a. Echo 7/14:  Mod LVH, EF 55-60%, Gr 2 DD, MAC, mild LAE, PASP 32, trivial Eff.  . Ejection fraction    .  Marland Kitchen History of verruca vulgaris   . Hypercalcemia   . Hyperlipidemia   . Hyperparathyroidism ([place])   . Hypertension   . Multiple thyroid nodules   . PAF (paroxysmal atrial fibrillation) ([place])   . 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 Preop cardiovascular exam    Preop clearance for thyroid surgery June, 2013  . Thyroid cancer ([place])   . Vitamin D deficiency   . Vitiligo     Past Surgical History:  Procedure Laterality Date  . KIDNEY TRANSPLANT  2004  . THYROIDECTOMY  08/17/2011   Procedure: THYROIDECTOMY;  Surgeon: Earnstine Regal, MD;  Location: [facility];  Service: General;  Laterality: N/A;    Patient Active Problem List   Diagnosis Date Noted  . Parkinsonism ([place]) 11/04/2016  . Gait abnormality 11/04/2016  . Hypothyroidism (acquired) 05/16/2013  . Encounter for therapeutic drug monitoring 03/23/2013  . Warfarin anticoagulation 10/01/2012  . Ejection fraction   . Atrial fibrillation ([place]) 08/08/2012  . Thyroid cancer ([place]) 08/27/2011  . Chronic kidney disease   . Hypertension     ROS   Patient denies fever, chills, headache, sweats, rash, change in vision, change in hearing, chest pain, cough, nausea vomiting, urinary symptoms. All other systems are reviewed and are negative.  PHYSICAL EXAM  BP 118/74   Pulse 83   Ht _0  (1.88 m)   Wt 165 lb 4 oz (75 kg)   BMI 21.22 kg/m  Affect appropriate Healthy:  appears stated age 46: normal Neck  supple with no adenopathy JVP normal no bruits no thyromegaly Lungs clear with no wheezing and good diaphragmatic motion Heart:  S1/S2 no murmur, no rub, gallop or click PMI normal Abdomen: benighn, BS positve, no tenderness, no AAA transplant kidney LLQ no bruit.  No HSM or HJR Distal pulses intact with no bruits No edema Neuro non-focal tremor in RUE and LLE with halting speech  Skin warm and dry No muscular weakness    ECG:  05/23/13  SR rate 60 LVH  03/17/15  SB rate 51  LVH nonspecific lateral T wave changes  SR rate 83 read as afib due to parkinson's tremor   ASSESSMENT & PLAN CRF:  Post transplant f/u [place] [facility]  Cr 1.38-1.8 stable Thyroid: post surgery for cancer on synthroid replacement follow Calcium PAF:  maint NSR f/u coumadin clinic no bleeding issues  HTN:  Well controlled.  Continue current medications and low sodium Dash type diet.   DM:  Discussed low carb diet.  Target hemoglobin A1c is 6.5 or less.  Continue current medications. GI:  Post polyp removal 07/07/16 pathology benign f/u GI  Parkinson's f/u neuro ? Start sinemet or other similar meds    F.u with me in a year coumadin clinic today   Jenkins Rouge

## 2017-04-07 ENCOUNTER — Encounter: Payer: Self-pay | Admitting: Cardiovascular Disease

## 2017-04-07 ENCOUNTER — Ambulatory Visit (INDEPENDENT_AMBULATORY_CARE_PROVIDER_SITE_OTHER): Payer: Medicare Other | Admitting: *Deleted

## 2017-04-07 ENCOUNTER — Ambulatory Visit (INDEPENDENT_AMBULATORY_CARE_PROVIDER_SITE_OTHER): Payer: Medicare Other | Admitting: Cardiovascular Disease

## 2017-04-07 VITALS — BP 118/74 | HR 83 | Ht 74.0 in | Wt 165.2 lb

## 2017-04-07 DIAGNOSIS — I4891 Unspecified atrial fibrillation: Secondary | ICD-10-CM

## 2017-04-07 DIAGNOSIS — Z5181 Encounter for therapeutic drug level monitoring: Secondary | ICD-10-CM | POA: Diagnosis not present

## 2017-04-07 DIAGNOSIS — I10 Essential (primary) hypertension: Secondary | ICD-10-CM

## 2017-04-07 LAB — POCT INR: INR: 2.5

## 2017-04-07 NOTE — Patient Instructions (Addendum)

## 2017-04-07 NOTE — Patient Instructions (Signed)
Description   Continue taking 1 tablet everyday except 1/2 tablet on Wednesdays and Fridays.  Recheck in 4 weeks. Call us with any medication changes or concerns [phone] [facility], Main # [phone].

## 2017-04-26 ENCOUNTER — Other Ambulatory Visit: Payer: Self-pay | Admitting: Cardiovascular Disease

## 2017-05-09 DIAGNOSIS — E890 Postprocedural hypothyroidism: Secondary | ICD-10-CM | POA: Diagnosis not present

## 2017-05-13 ENCOUNTER — Encounter (INDEPENDENT_AMBULATORY_CARE_PROVIDER_SITE_OTHER): Payer: Self-pay

## 2017-05-13 ENCOUNTER — Ambulatory Visit (INDEPENDENT_AMBULATORY_CARE_PROVIDER_SITE_OTHER): Payer: Medicare Other | Admitting: *Deleted

## 2017-05-13 DIAGNOSIS — Z5181 Encounter for therapeutic drug level monitoring: Secondary | ICD-10-CM | POA: Diagnosis not present

## 2017-05-13 DIAGNOSIS — I4891 Unspecified atrial fibrillation: Secondary | ICD-10-CM

## 2017-05-13 LAB — POCT INR: INR: 2.4

## 2017-05-13 NOTE — Patient Instructions (Signed)
Description   Continue taking 1 tablet everyday except 1/2 tablet on Wednesdays and Fridays.  Recheck in 6 weeks. Call us with any medication changes or concerns #[phone] [facility], Main # [phone].     

## 2017-05-23 DIAGNOSIS — I10 Essential (primary) hypertension: Secondary | ICD-10-CM | POA: Diagnosis not present

## 2017-05-23 DIAGNOSIS — Z940 Kidney transplant status: Secondary | ICD-10-CM | POA: Diagnosis not present

## 2017-05-23 DIAGNOSIS — N2581 Secondary hyperparathyroidism of renal origin: Secondary | ICD-10-CM | POA: Diagnosis not present

## 2017-05-23 DIAGNOSIS — Z0000 Encounter for general adult medical examination without abnormal findings: Secondary | ICD-10-CM | POA: Diagnosis not present

## 2017-05-23 DIAGNOSIS — I480 Paroxysmal atrial fibrillation: Secondary | ICD-10-CM | POA: Diagnosis not present

## 2017-05-23 DIAGNOSIS — E139 Other specified diabetes mellitus without complications: Secondary | ICD-10-CM | POA: Diagnosis not present

## 2017-05-23 DIAGNOSIS — G20 Parkinson's disease: Secondary | ICD-10-CM | POA: Diagnosis not present

## 2017-05-23 DIAGNOSIS — Z85850 Personal history of malignant neoplasm of thyroid: Secondary | ICD-10-CM | POA: Diagnosis not present

## 2017-05-23 DIAGNOSIS — E785 Hyperlipidemia, unspecified: Secondary | ICD-10-CM | POA: Diagnosis not present

## 2017-05-24 ENCOUNTER — Other Ambulatory Visit (HOSPITAL_COMMUNITY): Payer: Self-pay | Admitting: Nephrology

## 2017-05-24 DIAGNOSIS — N2581 Secondary hyperparathyroidism of renal origin: Secondary | ICD-10-CM

## 2017-06-09 DIAGNOSIS — Z79899 Other long term (current) drug therapy: Secondary | ICD-10-CM | POA: Diagnosis not present

## 2017-06-24 ENCOUNTER — Ambulatory Visit (INDEPENDENT_AMBULATORY_CARE_PROVIDER_SITE_OTHER): Payer: Medicare Other | Admitting: *Deleted

## 2017-06-24 DIAGNOSIS — Z5181 Encounter for therapeutic drug level monitoring: Secondary | ICD-10-CM | POA: Diagnosis not present

## 2017-06-24 DIAGNOSIS — I4891 Unspecified atrial fibrillation: Secondary | ICD-10-CM

## 2017-06-24 LAB — POCT INR: INR: 2.4

## 2017-06-24 NOTE — Patient Instructions (Signed)
Description   Continue taking 1 tablet everyday except 1/2 tablet on Wednesdays and Fridays.  Recheck in 6 weeks. Call us with any medication changes or concerns #[phone] [facility], Main # [phone].     

## 2017-07-01 ENCOUNTER — Encounter (HOSPITAL_COMMUNITY): Payer: Medicare Other

## 2017-07-01 ENCOUNTER — Other Ambulatory Visit: Payer: Self-pay

## 2017-07-01 ENCOUNTER — Encounter (HOSPITAL_COMMUNITY)
Admission: RE | Admit: 2017-07-01 | Discharge: 2017-07-01 | Disposition: A | Discharge status: Home / Self Care | Payer: Medicare Other | Source: Ambulatory Visit | Attending: Nephrology | Admitting: Nephrology

## 2017-07-01 ENCOUNTER — Encounter (HOSPITAL_COMMUNITY): Payer: Self-pay | Admitting: *Deleted

## 2017-07-01 ENCOUNTER — Emergency Department (HOSPITAL_COMMUNITY): Payer: Medicare Other

## 2017-07-01 ENCOUNTER — Encounter (HOSPITAL_COMMUNITY): Payer: Self-pay

## 2017-07-01 ENCOUNTER — Emergency Department (HOSPITAL_COMMUNITY)
Admission: EM | Admit: 2017-07-01 | Discharge: 2017-07-01 | Disposition: A | Discharge status: Home / Self Care | Payer: Medicare Other | Attending: Emergency Medicine | Admitting: Emergency Medicine

## 2017-07-01 DIAGNOSIS — W0110XA Fall on same level from slipping, tripping and stumbling with subsequent striking against unspecified object, initial encounter: Secondary | ICD-10-CM | POA: Insufficient documentation

## 2017-07-01 DIAGNOSIS — I120 Hypertensive chronic kidney disease with stage 5 chronic kidney disease or end stage renal disease: Secondary | ICD-10-CM | POA: Insufficient documentation

## 2017-07-01 DIAGNOSIS — E1122 Type 2 diabetes mellitus with diabetic chronic kidney disease: Secondary | ICD-10-CM | POA: Diagnosis not present

## 2017-07-01 DIAGNOSIS — W19XXXA Unspecified fall, initial encounter: Secondary | ICD-10-CM

## 2017-07-01 DIAGNOSIS — S0990XA Unspecified injury of head, initial encounter: Secondary | ICD-10-CM | POA: Diagnosis not present

## 2017-07-01 DIAGNOSIS — Z992 Dependence on renal dialysis: Secondary | ICD-10-CM | POA: Diagnosis not present

## 2017-07-01 DIAGNOSIS — G20 Parkinson's disease: Secondary | ICD-10-CM | POA: Diagnosis not present

## 2017-07-01 DIAGNOSIS — Z85850 Personal history of malignant neoplasm of thyroid: Secondary | ICD-10-CM | POA: Insufficient documentation

## 2017-07-01 DIAGNOSIS — Z79899 Other long term (current) drug therapy: Secondary | ICD-10-CM | POA: Diagnosis not present

## 2017-07-01 DIAGNOSIS — R55 Syncope and collapse: Secondary | ICD-10-CM | POA: Diagnosis not present

## 2017-07-01 DIAGNOSIS — Z7984 Long term (current) use of oral hypoglycemic drugs: Secondary | ICD-10-CM | POA: Diagnosis not present

## 2017-07-01 DIAGNOSIS — Y939 Activity, unspecified: Secondary | ICD-10-CM | POA: Insufficient documentation

## 2017-07-01 DIAGNOSIS — Y999 Unspecified external cause status: Secondary | ICD-10-CM | POA: Diagnosis not present

## 2017-07-01 DIAGNOSIS — Y92238 Other place in hospital as the place of occurrence of the external cause: Secondary | ICD-10-CM | POA: Diagnosis not present

## 2017-07-01 DIAGNOSIS — Z7901 Long term (current) use of anticoagulants: Secondary | ICD-10-CM | POA: Insufficient documentation

## 2017-07-01 DIAGNOSIS — N2581 Secondary hyperparathyroidism of renal origin: Secondary | ICD-10-CM

## 2017-07-01 DIAGNOSIS — N186 End stage renal disease: Secondary | ICD-10-CM | POA: Insufficient documentation

## 2017-07-01 DIAGNOSIS — R61 Generalized hyperhidrosis: Secondary | ICD-10-CM | POA: Diagnosis not present

## 2017-07-01 LAB — CBC
HCT: 36.8 % — ABNORMAL LOW (ref 39.0–52.0)
HEMOGLOBIN: 12.2 g/dL — AB (ref 13.0–17.0)
MCH: 29.5 pg (ref 26.0–34.0)
MCHC: 33.2 g/dL (ref 30.0–36.0)
MCV: 89.1 fL (ref 78.0–100.0)
Platelets: 201 10*3/uL (ref 150–400)
RBC: 4.13 MIL/uL — AB (ref 4.22–5.81)
RDW: 15 % (ref 11.5–15.5)
WBC: 4.3 10*3/uL (ref 4.0–10.5)

## 2017-07-01 LAB — CBG MONITORING, ED: GLUCOSE-CAPILLARY: 110 mg/dL — AB (ref 65–99)

## 2017-07-01 LAB — BASIC METABOLIC PANEL
ANION GAP: 10 (ref 5–15)
BUN: 30 mg/dL — ABNORMAL HIGH (ref 6–20)
CHLORIDE: 108 mmol/L (ref 101–111)
CO2: 22 mmol/L (ref 22–32)
Calcium: 10.3 mg/dL (ref 8.9–10.3)
Creatinine, Ser: 1.76 mg/dL — ABNORMAL HIGH (ref 0.61–1.24)
GFR calc Af Amer: 47 mL/min — ABNORMAL LOW (ref >60)
GFR calc non Af Amer: 40 mL/min — ABNORMAL LOW (ref >60)
Glucose, Bld: 135 mg/dL — ABNORMAL HIGH (ref 65–99)
Potassium: 4.4 mmol/L (ref 3.5–5.1)
SODIUM: 140 mmol/L (ref 135–145)

## 2017-07-01 LAB — PROTIME-INR
INR: 1.93
PROTHROMBIN TIME: 21.9 s — AB (ref 11.4–15.2)

## 2017-07-01 NOTE — ED Notes (Signed)
Bed: RESA Expected date:  Expected time:  Means of arrival:  Comments: Rapid response pt

## 2017-07-01 NOTE — Discharge Instructions (Addendum)
You are evaluated in the emergency department for a head injury associated with your fall.  You had a CAT scan that did not show any signs of bleeding.  You can take Tylenol for pain and use ice to the areas that are sore.  Please follow-up with your doctor and return to the emergency department if any worsening symptoms.

## 2017-07-01 NOTE — ED Triage Notes (Signed)
Pt was in IR, getting ready for a parathyroid scan, when he passed out and hit his left head. Pt states he was sitting in a chair, getting ready to have an IV inserted. Pt lost consciousness, complains of left sided head pain. Pt is on warfarin.

## 2017-07-01 NOTE — ED Provider Notes (Signed)
[facility] DEPT Provider Note   CSN: [id number] Arrival date & time: 07/01/17  1037     History   Chief Complaint Chief Complaint  Patient presents with  . Fall  . Loss of Consciousness    HPI Randy [name] is a 62 y.o. male.  62 year old male with CKD A. fib on anticoagulation was here getting an IV placed to undergo a parathyroid scan when he acutely felt lightheaded.  When staff member went to go get him a drink of water the patient passed out and fell to the ground striking his head mildly on the left side of his cheek.  He was brought down to the ER for emergency evaluation.  Currently denies any complaints and states he feels back to baseline.  He is never passed out before getting IV placed he wants to know if he will be able to be released so he can go back to his test.  He had taken his regular meds except for the warfarin which he took last night.  He had not eaten or drank anything today though and preparation for the test.  The history is provided by the patient.  Loss of Consciousness   This is a new problem. The current episode started less than 1 hour ago. The problem has been resolved. He lost consciousness for a period of less than one minute. The problem is associated with normal activity. Associated symptoms include diaphoresis and light-headedness. Pertinent negatives include abdominal pain, back pain, chest pain, fever, headaches and vomiting. He has tried nothing for the symptoms. The treatment provided significant relief. His past medical history is significant for DM.    Past Medical History:  Diagnosis Date  . Arthritis    knees  . Atrial fibrillation ([place])    dx 07/2012; CHADS2=2; coumadin started  . Chronic kidney disease    dialysis; kidney transplant  2004  . Diabetes ([place])   . Dyslipidemia   . Ejection fraction    a. Echo 7/14:  Mod LVH, EF 55-60%, Gr 2 DD, MAC, mild LAE, PASP 32, trivial Eff.  . Ejection fraction    .    Marland Kitchen History of verruca vulgaris   . Hypercalcemia   . Hyperlipidemia   . Hyperparathyroidism ([place])   . Hypertension   . Multiple thyroid nodules   . PAF (paroxysmal atrial fibrillation) ([place])   . 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 Preop cardiovascular exam    Preop clearance for thyroid surgery June, 2013  . Thyroid cancer ([place])   . Vitamin D deficiency   . Vitiligo     Patient Active Problem List   Diagnosis Date Noted  . Parkinsonism ([place]) 11/04/2016  . Gait abnormality 11/04/2016  . Hypothyroidism (acquired) 05/16/2013  . Encounter for therapeutic drug monitoring 03/23/2013  . Warfarin anticoagulation 10/01/2012  . Ejection fraction   . Atrial fibrillation ([place]) 08/08/2012  . Thyroid cancer ([place]) 08/27/2011  . Chronic kidney disease   . Hypertension     Past Surgical History:  Procedure Laterality Date  . KIDNEY TRANSPLANT  2004  . THYROIDECTOMY  08/17/2011   Procedure: THYROIDECTOMY;  Surgeon: Earnstine Regal, MD;  Location: [facility] ORS;  Service: General;  Laterality: N/A;        Home Medications    Prior to Admission medications   Medication Sig Start Date End Date Taking? Authorizing Provider  amLODipine (NORVASC) 10 MG tablet Take 10 mg by mouth 2 (two) times daily.  [provider]  atorvastatin (LIPITOR) 80 MG tablet Take 80 mg by mouth daily.    [provider]  carbidopa-levodopa (SINEMET IR) 25-100 MG tablet Take 1 tablet 3 (three) times daily by mouth. 01/05/17   Marcial Pacas, MD  cloNIDine (CATAPRES) 0.3 MG tablet Take 0.3 mg by mouth 2 (two) times daily.    [provider]  cycloSPORINE modified (NEORAL) 100 MG capsule Take 125 mg by mouth 2 (two) times daily.     [provider]  enalapril (VASOTEC) 5 MG tablet Take 5 mg by mouth at bedtime.     [provider]  furosemide (LASIX) 40 MG tablet Take 40 mg by mouth daily.    [provider]  glipiZIDE (GLUCOTROL XL) 5 MG 24 hr tablet Take 5 mg by  mouth daily.    [provider]  levothyroxine (SYNTHROID, LEVOTHROID) 112 MCG tablet Take 1 tablet by mouth daily. 04/04/17   [provider]  levothyroxine (SYNTHROID, LEVOTHROID) 137 MCG tablet Take 137 mcg by mouth daily before breakfast.    [provider]  metoprolol succinate (TOPROL-XL) 50 MG 24 hr tablet Take 1 tablet (50 mg total) by mouth daily. Take with or immediately following a meal. 03/17/15   Josue Hector, MD  mycophenolate (MYFORTIC) 360 MG TBEC Take 720 mg by mouth 2 (two) times daily.     [provider]  predniSONE (DELTASONE) 5 MG tablet Take 5 mg by mouth daily.    [provider]  warfarin (COUMADIN) 5 MG tablet USE AS DIRECTED BY  COUMADIN  CLINIC 04/26/17   Josue Hector, MD    Family History Family History  Problem Relation Age of Onset  . Dementia Mother   . Diabetes Mother   . Stroke Father   . Diabetes Father     Social History Social History   Tobacco Use  . Smoking status: Never Smoker  . Smokeless tobacco: Never Used  Substance Use Topics  . Alcohol use: No    Comment: no use since 2013  . Drug use: No    Types: Marijuana    Comment: previous use - not since 1993     Allergies   Patient has no known allergies.   Review of Systems Review of Systems  Constitutional: Positive for diaphoresis. Negative for fever.  HENT: Negative for sore throat.   Eyes: Negative for pain and visual disturbance.  Respiratory: Negative for shortness of breath.   Cardiovascular: Positive for syncope. Negative for chest pain.  Gastrointestinal: Negative for abdominal pain and vomiting.  Genitourinary: Negative for dysuria and hematuria.  Musculoskeletal: Negative for back pain and neck pain.  Skin: Negative for rash.  Neurological: Positive for syncope and light-headedness. Negative for headaches.     Physical Exam Updated Vital Signs BP 104/75 (BP Location: Right Arm)   Pulse 61   Temp 97.8 F (36.6 C)  (Oral)   Resp 12   SpO2 99%   Physical Exam  Constitutional: He is oriented to person, place, and time. He appears well-developed and well-nourished.  HENT:  Head: Normocephalic and atraumatic.  Eyes: Conjunctivae are normal.  Neck: Neck supple.  Cardiovascular: Normal rate and regular rhythm.  No murmur heard. Pulmonary/Chest: Effort normal and breath sounds normal. No respiratory distress.  Abdominal: Soft. There is no tenderness.  Musculoskeletal: He exhibits no edema, tenderness or deformity.  Fistula left forearm with positive thrill.  Neurological: He is alert and oriented to person, place, and time. He  has normal strength. No cranial nerve deficit or sensory deficit. GCS eye subscore is 4. GCS verbal subscore is 5. GCS motor subscore is 6.  Skin: Skin is warm and dry.  Psychiatric: He has a normal mood and affect.  Nursing note and vitals reviewed.    ED Treatments / Results  Labs (all labs ordered are listed, but only abnormal results are displayed) Labs Reviewed  BASIC METABOLIC PANEL - Abnormal; Notable for the following components:      Result Value   Glucose, Bld 135 (*)    BUN 30 (*)    Creatinine, Ser 1.76 (*)    GFR calc non Af Amer 40 (*)    GFR calc Af Amer 47 (*)    All other components within normal limits  CBC - Abnormal; Notable for the following components:   RBC 4.13 (*)    Hemoglobin 12.2 (*)    HCT 36.8 (*)    All other components within normal limits  PROTIME-INR - Abnormal; Notable for the following components:   Prothrombin Time 21.9 (*)    All other components within normal limits  CBG MONITORING, ED - Abnormal; Notable for the following components:   Glucose-Capillary 110 (*)    All other components within normal limits    EKG EKG Interpretation  Date/Time:  Friday Jul 01 2017 10:52:27 EDT Ventricular Rate:  57 PR Interval:    QRS Duration: 89 QT Interval:  413 QTC Calculation: 403 R Axis:   17 Text Interpretation:  Sinus rhythm  Minimal ST elevation, inferior leads Confirmed by Davonna Belling [id number]) on 07/02/2017 7:43:28 PM   Radiology No results found.  CT Head Wo Contrast  Final Result       Procedures Procedures (including critical care time)  Medications Ordered in ED Medications - No data to display   Initial Impression / Assessment and Plan / ED Course  I have reviewed the triage vital signs and the nursing notes.  Pertinent labs & imaging results that were available during my care of the patient were reviewed by me and considered in my medical decision making (see chart for details).  Clinical Course as of Jul 02 1118  Fri Jul 01, 2017  1119 EKG normal sinus rhythm normal intervals no acute ST-T changes.   [MB]    Clinical Course User Index [MB] Hayden Rasmussen, MD     Final Clinical Impressions(s) / ED Diagnoses   Final diagnoses:  Injury of head, initial encounter  Fall, initial encounter    ED Discharge Orders    None       Hayden Rasmussen, MD 07/03/17 1340

## 2017-07-04 ENCOUNTER — Encounter (HOSPITAL_COMMUNITY)
Admission: RE | Admit: 2017-07-04 | Discharge: 2017-07-04 | Disposition: A | Discharge status: Home / Self Care | Payer: Medicare Other | Source: Ambulatory Visit | Attending: Nephrology | Admitting: Nephrology

## 2017-07-04 DIAGNOSIS — E213 Hyperparathyroidism, unspecified: Secondary | ICD-10-CM | POA: Diagnosis not present

## 2017-07-04 MED ORDER — TECHNETIUM TC 99M SESTAMIBI - CARDIOLITE
25.8000 | Freq: Once | INTRAVENOUS | Status: AC | PRN
  Administered 2017-07-04: 10:00:00 25.8 via INTRAVENOUS

## 2017-07-06 ENCOUNTER — Ambulatory Visit: Payer: Medicare Other | Admitting: Neurology

## 2017-07-06 DIAGNOSIS — Z85850 Personal history of malignant neoplasm of thyroid: Secondary | ICD-10-CM | POA: Diagnosis not present

## 2017-07-06 DIAGNOSIS — E210 Primary hyperparathyroidism: Secondary | ICD-10-CM | POA: Diagnosis not present

## 2017-07-08 ENCOUNTER — Other Ambulatory Visit: Payer: Self-pay | Admitting: Surgery

## 2017-07-08 DIAGNOSIS — Z85850 Personal history of malignant neoplasm of thyroid: Secondary | ICD-10-CM

## 2017-07-08 DIAGNOSIS — E210 Primary hyperparathyroidism: Secondary | ICD-10-CM

## 2017-07-13 ENCOUNTER — Other Ambulatory Visit: Payer: Self-pay | Admitting: *Deleted

## 2017-07-13 MED ORDER — CARBIDOPA-LEVODOPA 25-100 MG PO TABS: 1.0000 | ORAL_TABLET | Freq: Three times a day (TID) | ORAL | 1 refills | Status: DC

## 2017-07-20 ENCOUNTER — Other Ambulatory Visit: Payer: Self-pay | Admitting: Neurology

## 2017-07-21 ENCOUNTER — Telehealth: Payer: Self-pay | Admitting: Neurology

## 2017-07-21 MED ORDER — CARBIDOPA-LEVODOPA 25-100 MG PO TABS: 1.0000 | ORAL_TABLET | Freq: Three times a day (TID) | ORAL | 0 refills | Status: DC

## 2017-07-21 NOTE — Telephone Encounter (Signed)
Rx faxed to [facility] for patient.

## 2017-07-21 NOTE — Telephone Encounter (Signed)
Randy [name] is requesting a refill for 2 weeks forcarbidopa-levodopa (SINEMET IR) 25-100 MG tablet. The pt is out today and has not rec'd the mail order refill.

## 2017-08-05 ENCOUNTER — Ambulatory Visit (INDEPENDENT_AMBULATORY_CARE_PROVIDER_SITE_OTHER): Payer: Medicare Other | Admitting: *Deleted

## 2017-08-05 ENCOUNTER — Encounter (INDEPENDENT_AMBULATORY_CARE_PROVIDER_SITE_OTHER): Payer: Self-pay

## 2017-08-05 DIAGNOSIS — I482 Chronic atrial fibrillation, unspecified: Secondary | ICD-10-CM

## 2017-08-05 DIAGNOSIS — Z5181 Encounter for therapeutic drug level monitoring: Secondary | ICD-10-CM

## 2017-08-05 LAB — POCT INR: INR: 2.8 (ref 2.0–3.0)

## 2017-08-05 NOTE — Patient Instructions (Signed)
Description   Continue taking 1 tablet everyday except 1/2 tablet on Wednesdays and Fridays.  Recheck in 6 weeks. Call us with any medication changes or concerns #[phone] [facility], Main # [phone].     

## 2017-08-22 ENCOUNTER — Other Ambulatory Visit: Payer: Self-pay | Admitting: Neurology

## 2017-08-22 ENCOUNTER — Other Ambulatory Visit: Payer: Self-pay | Admitting: Cardiovascular Disease

## 2017-09-08 DIAGNOSIS — E139 Other specified diabetes mellitus without complications: Secondary | ICD-10-CM | POA: Diagnosis not present

## 2017-09-08 DIAGNOSIS — I480 Paroxysmal atrial fibrillation: Secondary | ICD-10-CM | POA: Diagnosis not present

## 2017-09-08 DIAGNOSIS — G20 Parkinson's disease: Secondary | ICD-10-CM | POA: Diagnosis not present

## 2017-09-08 DIAGNOSIS — E785 Hyperlipidemia, unspecified: Secondary | ICD-10-CM | POA: Diagnosis not present

## 2017-09-08 DIAGNOSIS — Z940 Kidney transplant status: Secondary | ICD-10-CM | POA: Diagnosis not present

## 2017-09-08 DIAGNOSIS — Z0000 Encounter for general adult medical examination without abnormal findings: Secondary | ICD-10-CM | POA: Diagnosis not present

## 2017-09-08 DIAGNOSIS — Z85850 Personal history of malignant neoplasm of thyroid: Secondary | ICD-10-CM | POA: Diagnosis not present

## 2017-09-08 DIAGNOSIS — N2581 Secondary hyperparathyroidism of renal origin: Secondary | ICD-10-CM | POA: Diagnosis not present

## 2017-09-08 DIAGNOSIS — I10 Essential (primary) hypertension: Secondary | ICD-10-CM | POA: Diagnosis not present

## 2017-09-16 ENCOUNTER — Ambulatory Visit (INDEPENDENT_AMBULATORY_CARE_PROVIDER_SITE_OTHER): Payer: Medicare Other | Admitting: *Deleted

## 2017-09-16 DIAGNOSIS — Z5181 Encounter for therapeutic drug level monitoring: Secondary | ICD-10-CM

## 2017-09-16 DIAGNOSIS — I482 Chronic atrial fibrillation, unspecified: Secondary | ICD-10-CM

## 2017-09-16 LAB — POCT INR: INR: 2.6 (ref 2.0–3.0)

## 2017-09-16 NOTE — Patient Instructions (Signed)
Description   Continue taking 1 tablet everyday except 1/2 tablet on Wednesdays and Fridays.  Recheck in 6 weeks. Call us with any medication changes or concerns #[phone] [facility], Main # [phone].     

## 2017-10-19 ENCOUNTER — Encounter: Payer: Self-pay | Admitting: Neurology

## 2017-10-19 ENCOUNTER — Ambulatory Visit (INDEPENDENT_AMBULATORY_CARE_PROVIDER_SITE_OTHER): Payer: Medicare Other | Admitting: Neurology

## 2017-10-19 VITALS — BP 124/82 | HR 91 | Ht 74.0 in | Wt 172.0 lb

## 2017-10-19 DIAGNOSIS — G20 Parkinson's disease: Secondary | ICD-10-CM

## 2017-10-19 MED ORDER — CARBIDOPA-LEVODOPA 25-100 MG PO TABS: 1.0000 | ORAL_TABLET | Freq: Four times a day (QID) | ORAL | 4 refills | Status: DC

## 2017-10-19 NOTE — Progress Notes (Signed)
PATIENT: Randy [name] DOB: 03/31/1955  Chief Complaint  Patient presents with  . Parkinsonism/Gait abnormality    He is here with his sister, Randy [name].  They would like to review his DatScan and MRI results.  Feels both Sinemet and PT have improved his symptoms.     HISTORICAL  Randy [name] is a 62 year old male, accompanied by his sister Randy [name], seen in refer by  his primary care doctor Jamal Maes for evaluation of tremor, initial evaluation November 04 2016.  I reviewed and summarized referring note, he had a history of diabetes, atrial fibrillation, on chronic Coumadin treatment, kidney transplant on November 09 2002,  thyroidectomy, on supplement, hypertension, hyperlipidemia,  He noted gradual onset tremor since 2017, initially affecting left foot, gradually getting worse, now involving both arm, he also noticed mild unsteady gait, stooped forward, tends to lose his balance, decreased arm swing,  He become less active, complains of weight loss over the past 1 year, he denied loss sense of smell, he sleeps well, no REM sleep disorder, no constipation  There is no family history of termor.  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  UPDATE Jan 05 2017: Data scan showed clear loss of dopamine transporter protein in bilateral straight out, most severe in the putamen, but also involving the caudate head,  left greater than right in the typical pattern  of Parkinson syndrome.  MRI of the brain in August 2018 showed mild atrophy, chronic small vessel disease. MRI of cervical spine in September 2018 showed mild spondylitic disease, there was no significant canal or foraminal narrowing He is now taking Sinemet 25/100 mg at 8, noon, 7 PM,  UPDATE October 19 2017: He is accompanied by his sister at today's clinical visit, is taking Sinemet and 25/100 mg, take effect 20 minutes after empty stomach, and if it lasted 3 to 4 hours, early afternoon about 2-3 PM after second dose, he often feels exhausted, he denies difficulty turning in bed, he could not tolerate Requip due to GI side effect, dizziness.   REVIEW OF SYSTEMS: Full 14 system review of systems performed and notable only for  As above.  ALLERGIES: No Known Allergies  HOME MEDICATIONS: Current Outpatient Medications  Medication Sig Dispense Refill  . amLODipine (NORVASC) 10 MG tablet Take 10 mg by mouth 2 (two) times daily.    Marland Kitchen atorvastatin (LIPITOR) 80 MG tablet Take 80 mg by mouth daily.    . carbidopa-levodopa (SINEMET IR) 25-100 MG tablet Take 1 tablet by mouth 3 (three) times daily. 90 tablet 6  . cloNIDine (CATAPRES) 0.3 MG tablet Take 0.3 mg by mouth 2 (two) times daily.    . cycloSPORINE modified (NEORAL) 100 MG capsule Take 125 mg by mouth 2 (two) times daily.     . enalapril (VASOTEC) 5 MG tablet Take 5 mg by mouth at bedtime.     . furosemide (LASIX) 40 MG tablet Take 40 mg by mouth daily.    Marland Kitchen glipiZIDE (GLUCOTROL XL) 5 MG 24 hr  tablet Take 5 mg by mouth daily.    Marland Kitchen levothyroxine (SYNTHROID, LEVOTHROID) 137 MCG tablet Take 137 mcg by mouth daily before breakfast.    . metoprolol succinate (TOPROL-XL) 50 MG 24 hr tablet Take 1 tablet (50 mg total) by mouth daily. Take with or immediately following a meal. 90 tablet 3  . mycophenolate (MYFORTIC) 360 MG TBEC Take 720 mg by mouth 2 (two) times daily.     . predniSONE (DELTASONE) 5 MG tablet Take 5 mg by mouth daily.    Marland Kitchen  warfarin (COUMADIN) 5 MG tablet USE AS DIRECTED BY COUMADIN CLINIC 30 tablet 3   No current facility-administered medications for this visit.     PAST MEDICAL HISTORY: Past Medical History:  Diagnosis Date  . Arthritis    knees  . Atrial fibrillation ([place])    dx 07/2012; CHADS2=2; coumadin started  . Chronic kidney disease    dialysis; kidney transplant  2004  . Diabetes ([place])   . Dyslipidemia   . Ejection fraction    a. Echo 7/14:  Mod LVH, EF 55-60%, Gr 2 DD, MAC, mild LAE, PASP 32, trivial Eff.  . Ejection fraction    .  Marland Kitchen History of verruca vulgaris   . Hypercalcemia   . Hyperlipidemia   . Hyperparathyroidism ([place])   . Hypertension   . Multiple thyroid nodules   . PAF (paroxysmal atrial fibrillation) ([place])   . 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 Preop cardiovascular exam    Preop clearance for thyroid surgery June, 2013  . Thyroid cancer ([place])   . Vitamin D deficiency   . Vitiligo     PAST SURGICAL HISTORY: Past Surgical History:  Procedure Laterality Date  . KIDNEY TRANSPLANT  2004    FAMILY HISTORY: Family History  Problem Relation Age of Onset  . Dementia Mother   . Diabetes Mother   . Stroke Father   . Diabetes Father     SOCIAL HISTORY:  Social History   Socioeconomic History  . Marital status: Single    Spouse name: Not on file  . Number of children: 2  . Years of education: HS  . Highest education level: Not on file  Social Needs  . Financial resource strain: Not on file  . Food insecurity - worry: Not on file  . Food insecurity - inability: Not on file  . Transportation needs - medical: Not on file  . Transportation needs - non-medical: Not on file  Occupational History  . Occupation: Disabled  Tobacco Use  . Smoking status: Never Smoker  . Smokeless tobacco: Never Used  Substance and Sexual Activity  . Alcohol use: No    Comment: no use since 2013  . Drug use: No    Comment: previous use - not since 1993  . Sexual  activity: Not on file  Other Topics Concern  . Not on file  Social History Narrative   Lives at home alone.   Right-handed.   12 ounces of caffeine weekly.     PHYSICAL EXAM   Vitals:   01/05/17 1004  BP: 112/77  Pulse: 74  Weight: 168 lb (76.2 kg)  Height: _0  (1.88 m)    Not recorded      Body mass index is 21.57 kg/m.  PHYSICAL EXAMNIATION:  Gen: NAD, conversant, well nourised, obese, well groomed                     Cardiovascular: Regular rate rhythm, no  peripheral edema, warm, nontender. Eyes: Conjunctivae clear without exudates or hemorrhage Neck: Supple, no carotid bruits. Pulmonary: Clear to auscultation bilaterally   NEUROLOGICAL EXAM:  MENTAL STATUS: Speech:    Speech is normal; fluent and spontaneous with normal comprehension.  Cognition:     Orientation to time, place and person     Normal recent and remote memory     Normal Attention span and concentration     Normal Language, naming, repeating,spontaneous speech     Fund of knowledge   CRANIAL NERVES: CN II: Visual fields are full to confrontation. Fundoscopic exam is normal with sharp discs and no vascular changes. Pupils are round equal and briskly reactive to light. CN III, IV, VI: extraocular movement are normal. No ptosis. CN V: Facial sensation is intact to pinprick in all 3 divisions bilaterally. Corneal responses are intact.  CN VII: Face is symmetric with normal eye closure and smile. CN VIII: Hearing is normal to rubbing fingers CN IX, X: Palate elevates symmetrically. Phonation is normal. CN XI: Head turning and shoulder shrug are intact CN XII: Tongue is midline with normal movements and no atrophy.  MOTOR: Mild left arm and leg resting tremor, mild left more than right rigidity, bradykinesia, no weakness.  REFLEXES: Reflexes are 3 and symmetric at the biceps, triceps, knees, and ankles. Plantar responses are extensor bilaterally.  SENSORY: Intact to light touch, pinprick,  positional sensation and vibratory sensation are intact in fingers and toes.  COORDINATION: Rapid alternating movements and fine finger movements are intact. There is no dysmetria on finger-to-nose and heel-knee-shin.    GAIT/STANCE: He needs push up to get up from seated position, stoop forward, mildly unsteady, cautious, decreased bilateral arm swing,   DIAGNOSTIC DATA (LABS, IMAGING, TESTING) - I reviewed patient records, labs, notes, testing and imaging myself where available.   ASSESSMENT AND PLAN  Deundra Bard is a 62 y.o. male   Idiopathic Parkinson's disease  DatScan showed decreased dopamine transporter protein at bilateral putamen, also involving caudate head, left worse than right,  May increase Sinemet 25/100 to four times a day  Could not tolerate requip due to dizziness, GI side effect,has high copay for requip xr.  Keep moderate exercise  Marcial Pacas, M.D. Ph.D.  [facility] [address] [place], [place] [address] Ph: [phone] Fax: [phone]  CC: Jamal Maes, MD

## 2017-10-28 ENCOUNTER — Ambulatory Visit (INDEPENDENT_AMBULATORY_CARE_PROVIDER_SITE_OTHER): Payer: Medicare Other | Admitting: *Deleted

## 2017-10-28 DIAGNOSIS — I482 Chronic atrial fibrillation, unspecified: Secondary | ICD-10-CM

## 2017-10-28 DIAGNOSIS — Z5181 Encounter for therapeutic drug level monitoring: Secondary | ICD-10-CM | POA: Diagnosis not present

## 2017-10-28 LAB — POCT INR: INR: 2.9 (ref 2.0–3.0)

## 2017-10-28 NOTE — Patient Instructions (Signed)
Description   Continue taking 1 tablet everyday except 1/2 tablet on Wednesdays and Fridays.  Recheck in 6 weeks. Call us with any medication changes or concerns #[phone] [facility], Main # [phone].     

## 2017-12-19 ENCOUNTER — Other Ambulatory Visit: Payer: Self-pay | Admitting: Cardiovascular Disease

## 2017-12-22 ENCOUNTER — Ambulatory Visit (INDEPENDENT_AMBULATORY_CARE_PROVIDER_SITE_OTHER): Payer: Medicare Other | Admitting: *Deleted

## 2017-12-22 DIAGNOSIS — I4891 Unspecified atrial fibrillation: Secondary | ICD-10-CM

## 2017-12-22 DIAGNOSIS — Z5181 Encounter for therapeutic drug level monitoring: Secondary | ICD-10-CM

## 2017-12-22 DIAGNOSIS — I482 Chronic atrial fibrillation, unspecified: Secondary | ICD-10-CM | POA: Diagnosis not present

## 2017-12-22 LAB — POCT INR: INR: 2.3 (ref 2.0–3.0)

## 2017-12-22 NOTE — Patient Instructions (Signed)
Description   Continue taking 1 tablet everyday except 1/2 tablet on Wednesdays and Fridays.  Recheck in 6 weeks. Call us with any medication changes or concerns #[phone] [facility], Main # [phone].     

## 2018-01-12 DIAGNOSIS — E139 Other specified diabetes mellitus without complications: Secondary | ICD-10-CM | POA: Diagnosis not present

## 2018-01-12 DIAGNOSIS — Z940 Kidney transplant status: Secondary | ICD-10-CM | POA: Diagnosis not present

## 2018-01-12 DIAGNOSIS — I10 Essential (primary) hypertension: Secondary | ICD-10-CM | POA: Diagnosis not present

## 2018-01-12 DIAGNOSIS — I480 Paroxysmal atrial fibrillation: Secondary | ICD-10-CM | POA: Diagnosis not present

## 2018-01-12 DIAGNOSIS — G20 Parkinson's disease: Secondary | ICD-10-CM | POA: Diagnosis not present

## 2018-01-12 DIAGNOSIS — E785 Hyperlipidemia, unspecified: Secondary | ICD-10-CM | POA: Diagnosis not present

## 2018-01-12 DIAGNOSIS — Z0000 Encounter for general adult medical examination without abnormal findings: Secondary | ICD-10-CM | POA: Diagnosis not present

## 2018-01-12 DIAGNOSIS — Z85850 Personal history of malignant neoplasm of thyroid: Secondary | ICD-10-CM | POA: Diagnosis not present

## 2018-02-02 ENCOUNTER — Ambulatory Visit (INDEPENDENT_AMBULATORY_CARE_PROVIDER_SITE_OTHER): Payer: Medicare Other | Admitting: *Deleted

## 2018-02-02 DIAGNOSIS — I482 Chronic atrial fibrillation, unspecified: Secondary | ICD-10-CM | POA: Diagnosis not present

## 2018-02-02 DIAGNOSIS — Z5181 Encounter for therapeutic drug level monitoring: Secondary | ICD-10-CM | POA: Diagnosis not present

## 2018-02-02 LAB — POCT INR: INR: 3.1 — AB (ref 2.0–3.0)

## 2018-02-02 NOTE — Patient Instructions (Addendum)
  Description   Today take 1/2 tablet, eat a leafy green vegetable, then Continue taking 1 tablet everyday except 1/2 tablet on Wednesdays and Fridays.  Recheck in 6 weeks. Call us with any medication changes or concerns [phone] [facility], Main # [phone].

## 2018-03-03 DIAGNOSIS — E1165 Type 2 diabetes mellitus with hyperglycemia: Secondary | ICD-10-CM | POA: Diagnosis not present

## 2018-03-03 DIAGNOSIS — I10 Essential (primary) hypertension: Secondary | ICD-10-CM | POA: Diagnosis not present

## 2018-03-03 DIAGNOSIS — C73 Malignant neoplasm of thyroid gland: Secondary | ICD-10-CM | POA: Diagnosis not present

## 2018-03-03 DIAGNOSIS — E890 Postprocedural hypothyroidism: Secondary | ICD-10-CM | POA: Diagnosis not present

## 2018-03-06 ENCOUNTER — Other Ambulatory Visit: Payer: Self-pay | Admitting: Endocrinology

## 2018-03-06 DIAGNOSIS — C73 Malignant neoplasm of thyroid gland: Secondary | ICD-10-CM

## 2018-03-09 ENCOUNTER — Ambulatory Visit
Admission: RE | Admit: 2018-03-09 | Discharge: 2018-03-09 | Disposition: A | Discharge status: Home / Self Care | Payer: Medicare Other | Source: Ambulatory Visit | Attending: Endocrinology | Admitting: Endocrinology

## 2018-03-09 DIAGNOSIS — Z85850 Personal history of malignant neoplasm of thyroid: Secondary | ICD-10-CM | POA: Diagnosis not present

## 2018-03-09 DIAGNOSIS — C73 Malignant neoplasm of thyroid gland: Secondary | ICD-10-CM

## 2018-03-16 ENCOUNTER — Ambulatory Visit (INDEPENDENT_AMBULATORY_CARE_PROVIDER_SITE_OTHER): Payer: Medicare Other

## 2018-03-16 DIAGNOSIS — Z5181 Encounter for therapeutic drug level monitoring: Secondary | ICD-10-CM

## 2018-03-16 DIAGNOSIS — I482 Chronic atrial fibrillation, unspecified: Secondary | ICD-10-CM | POA: Diagnosis not present

## 2018-03-16 LAB — POCT INR: INR: 2.8 (ref 2.0–3.0)

## 2018-03-16 NOTE — Patient Instructions (Signed)
Description   Continue taking 1 tablet everyday except 1/2 tablet on Wednesdays and Fridays.  Recheck in 6 weeks. Call us with any medication changes or concerns #[phone] [facility], Main # [phone].     

## 2018-03-17 IMAGING — CR DG CHEST 2V
2 series · 2 of 2 positions shown · non-contrast
Comparison: Chest x-ray dated 08/06/2011.

CLINICAL DATA: Hypercalcemia.

EXAM:
CHEST  2 VIEW

[w chest pa]
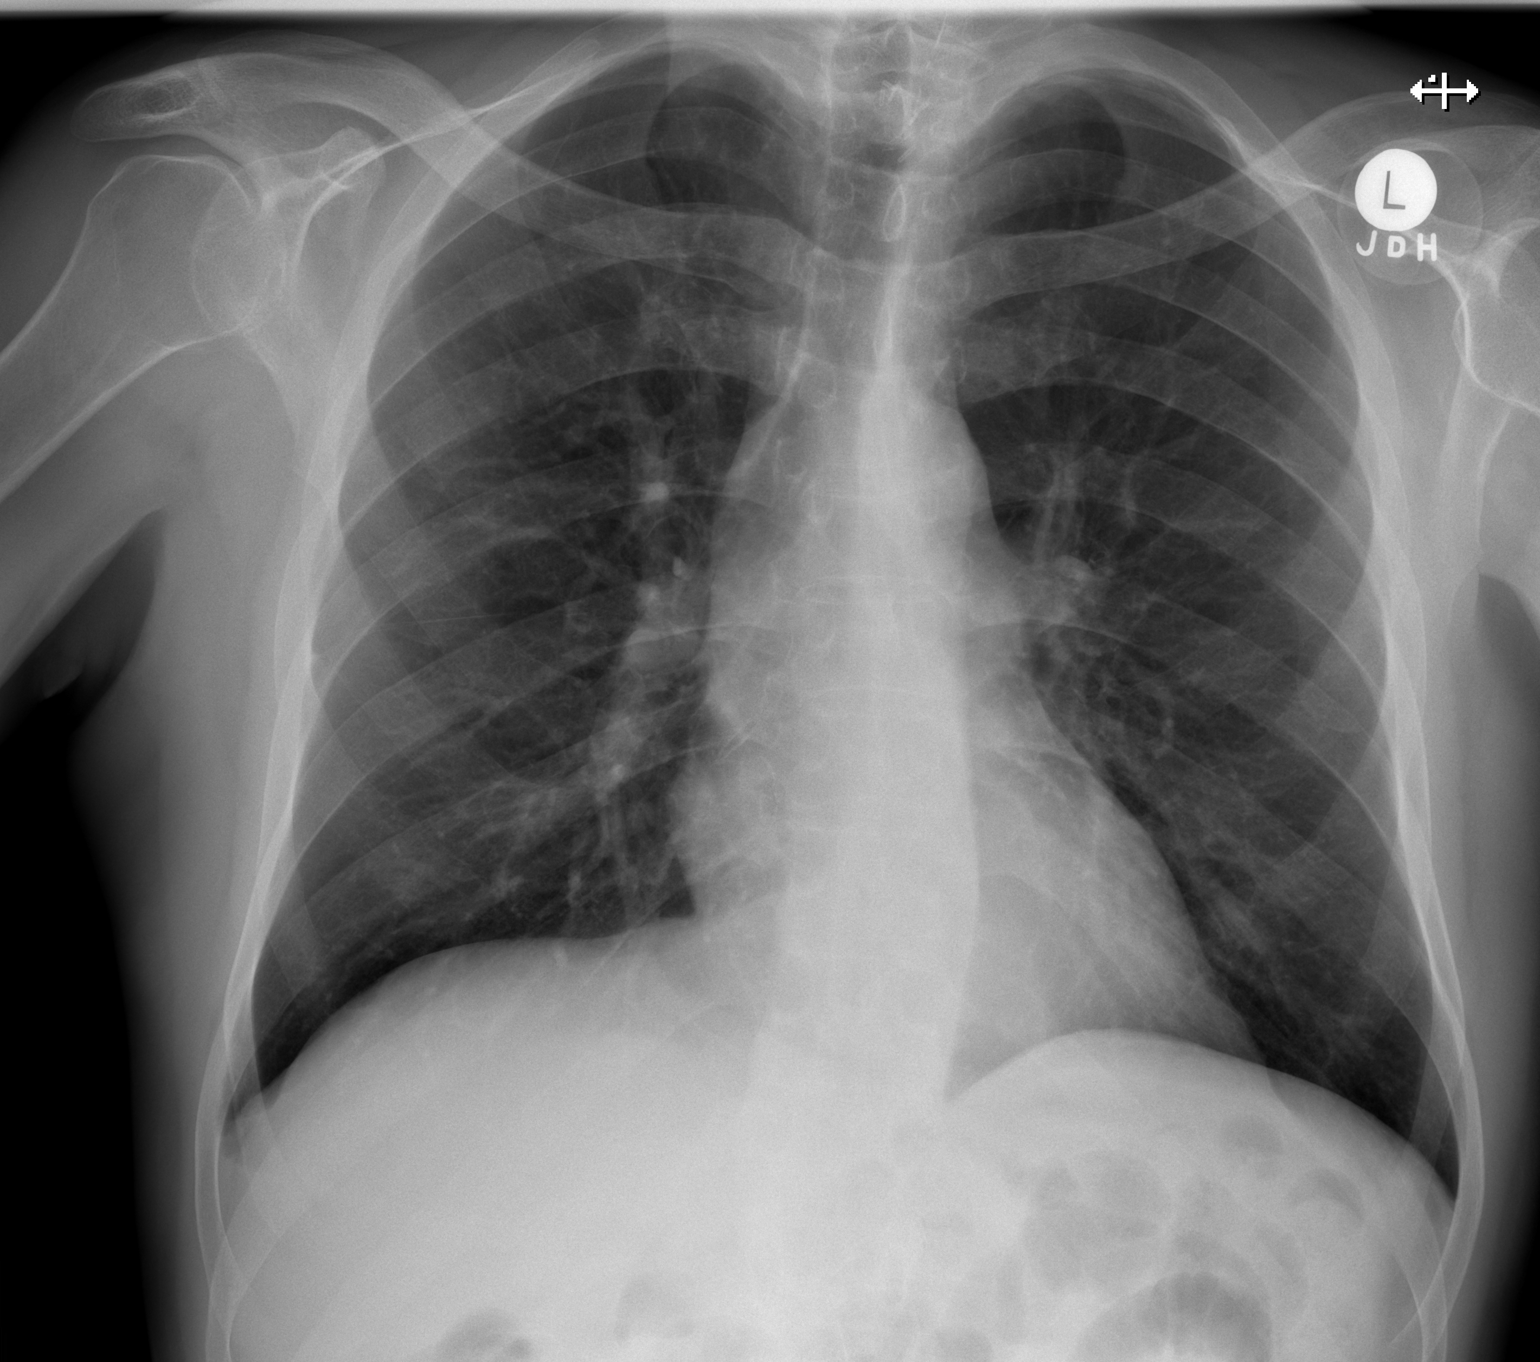

[w chest lat]
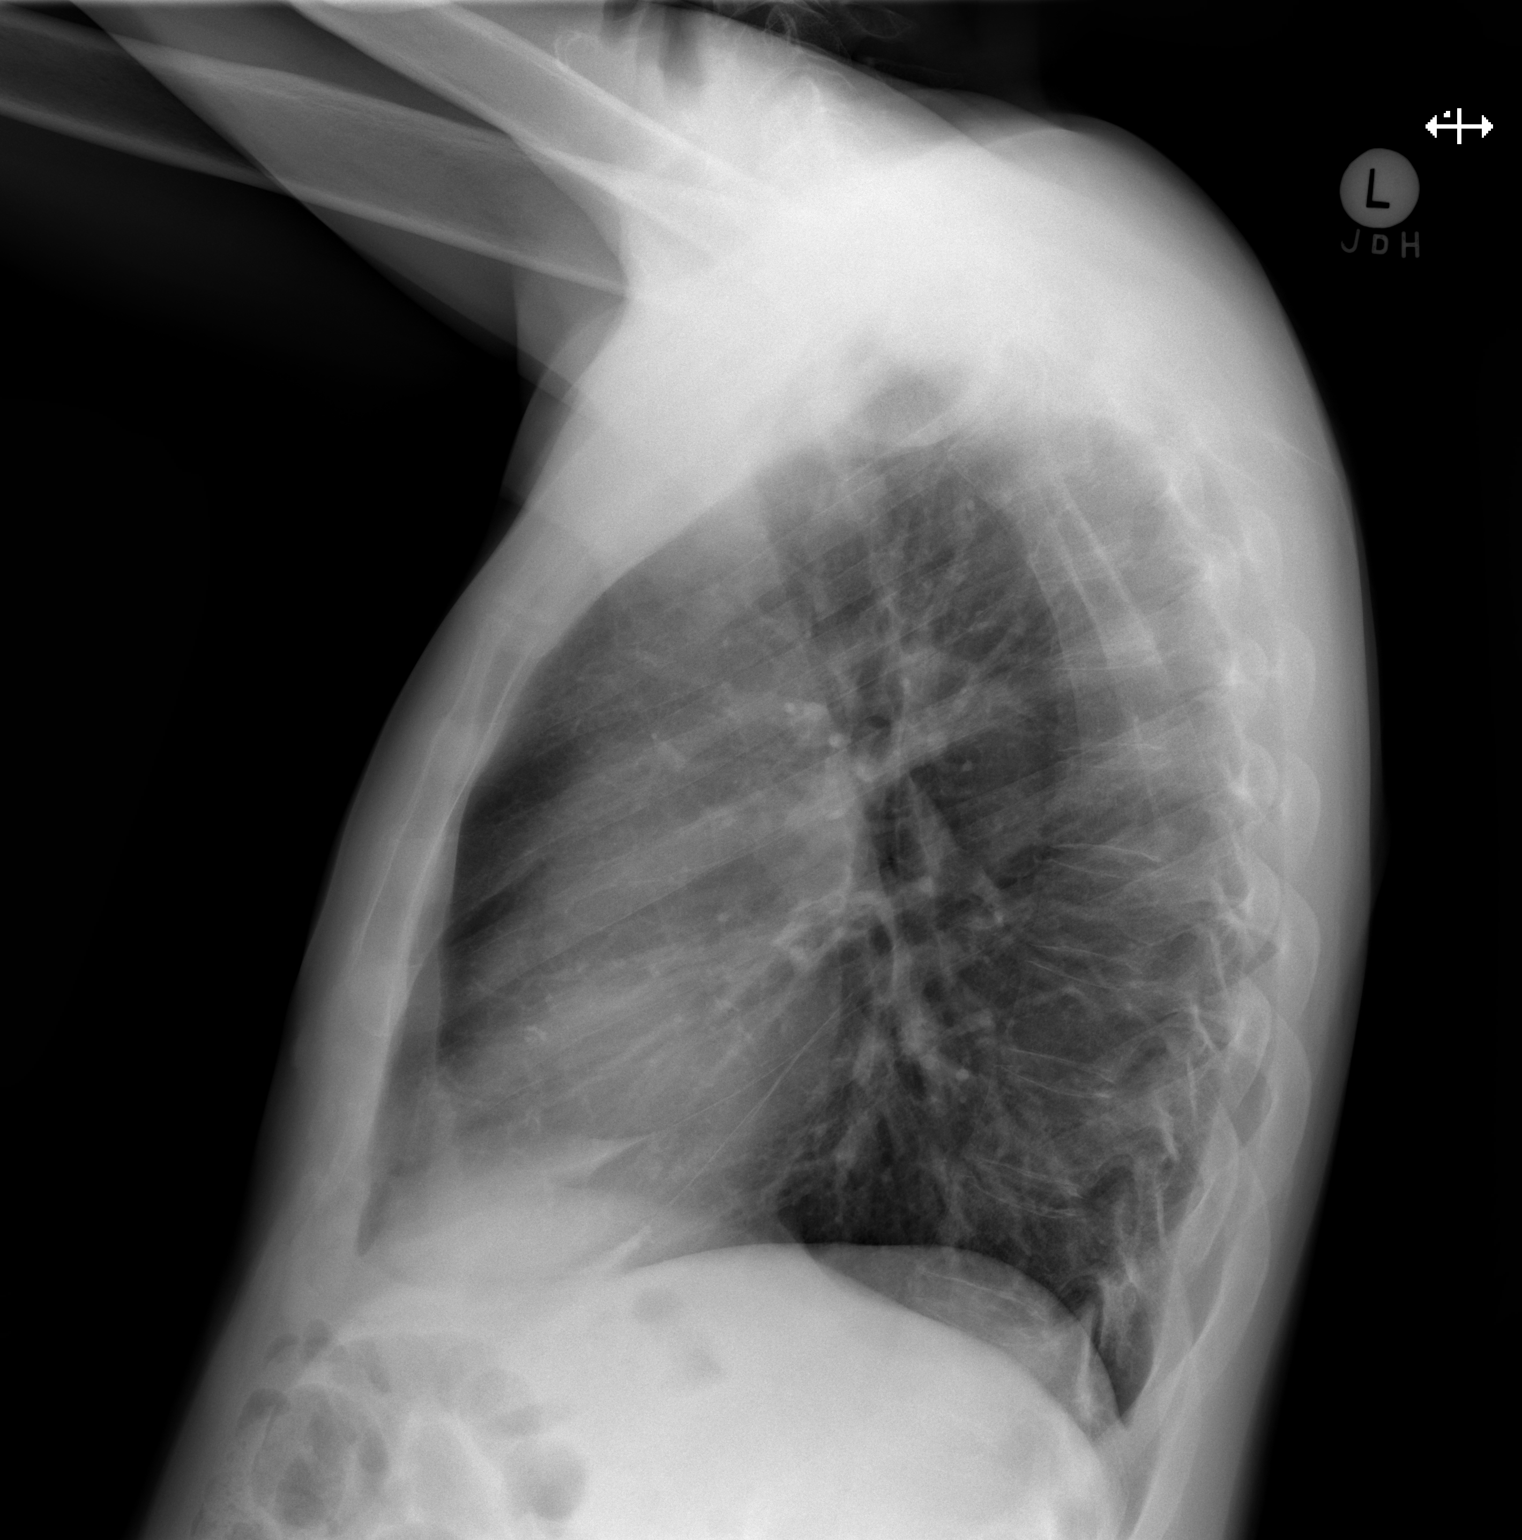

[2 of 2 positions shown; findings below may reference images not displayed]

FINDINGS: Heart size and mediastinal contours are within normal limits.

Questionable small pulmonary nodule within the right upper lobe.
Lungs otherwise clear. No pleural effusion or pneumothorax seen. No
acute or suspicious osseous finding.
IMPRESSION: 1. Questionable small pulmonary nodule within the right upper lobe.
Recommend further characterization with chest CT.
2. No acute findings.  No evidence of pneumonia or pulmonary edema.
These results will be called to the ordering clinician or
representative by the Radiologist Assistant, and communication
documented in the PACS or zVision Dashboard.

## 2018-04-21 ENCOUNTER — Other Ambulatory Visit: Payer: Self-pay | Admitting: Cardiovascular Disease

## 2018-04-26 DIAGNOSIS — E785 Hyperlipidemia, unspecified: Secondary | ICD-10-CM | POA: Diagnosis not present

## 2018-04-26 DIAGNOSIS — G20 Parkinson's disease: Secondary | ICD-10-CM | POA: Diagnosis not present

## 2018-04-26 DIAGNOSIS — Z85850 Personal history of malignant neoplasm of thyroid: Secondary | ICD-10-CM | POA: Diagnosis not present

## 2018-04-26 DIAGNOSIS — I10 Essential (primary) hypertension: Secondary | ICD-10-CM | POA: Diagnosis not present

## 2018-04-26 DIAGNOSIS — Z940 Kidney transplant status: Secondary | ICD-10-CM | POA: Diagnosis not present

## 2018-04-26 DIAGNOSIS — I480 Paroxysmal atrial fibrillation: Secondary | ICD-10-CM | POA: Diagnosis not present

## 2018-04-26 DIAGNOSIS — Z0000 Encounter for general adult medical examination without abnormal findings: Secondary | ICD-10-CM | POA: Diagnosis not present

## 2018-04-26 DIAGNOSIS — R3912 Poor urinary stream: Secondary | ICD-10-CM | POA: Diagnosis not present

## 2018-04-26 DIAGNOSIS — E139 Other specified diabetes mellitus without complications: Secondary | ICD-10-CM | POA: Diagnosis not present

## 2018-04-27 ENCOUNTER — Ambulatory Visit (INDEPENDENT_AMBULATORY_CARE_PROVIDER_SITE_OTHER): Payer: Medicare Other | Admitting: Pharmacist

## 2018-04-27 DIAGNOSIS — Z5181 Encounter for therapeutic drug level monitoring: Secondary | ICD-10-CM | POA: Diagnosis not present

## 2018-04-27 DIAGNOSIS — I482 Chronic atrial fibrillation, unspecified: Secondary | ICD-10-CM | POA: Diagnosis not present

## 2018-04-27 LAB — POCT INR: INR: 2.6 (ref 2.0–3.0)

## 2018-04-27 NOTE — Patient Instructions (Signed)
Continue taking 1 tablet everyday except 1/2 tablet on Wednesdays and Fridays.  Recheck in 6 weeks. Call us with any medication changes or concerns [phone] [facility], Main # [phone].

## 2018-05-22 ENCOUNTER — Other Ambulatory Visit: Payer: Self-pay | Admitting: Cardiovascular Disease

## 2018-06-07 ENCOUNTER — Telehealth: Payer: Self-pay

## 2018-06-07 NOTE — Telephone Encounter (Signed)

## 2018-06-08 ENCOUNTER — Ambulatory Visit (INDEPENDENT_AMBULATORY_CARE_PROVIDER_SITE_OTHER): Payer: Medicare Other | Admitting: Pharmacist

## 2018-06-08 ENCOUNTER — Other Ambulatory Visit: Payer: Self-pay

## 2018-06-08 DIAGNOSIS — Z5181 Encounter for therapeutic drug level monitoring: Secondary | ICD-10-CM | POA: Diagnosis not present

## 2018-06-08 DIAGNOSIS — I482 Chronic atrial fibrillation, unspecified: Secondary | ICD-10-CM

## 2018-06-08 DIAGNOSIS — I4891 Unspecified atrial fibrillation: Secondary | ICD-10-CM

## 2018-06-08 LAB — POCT INR: INR: 3.9 — AB (ref 2.0–3.0)

## 2018-06-09 ENCOUNTER — Telehealth: Payer: Self-pay | Admitting: Cardiovascular Disease

## 2018-06-09 NOTE — Progress Notes (Signed)
Virtual Visit via Video Note   This visit type was conducted due to national recommendations for restrictions regarding the COVID-19 Pandemic (e.g. social distancing) in an effort to limit this patient's exposure and mitigate transmission in our community.  Due to his co-morbid illnesses, this patient is at least at moderate risk for complications without adequate follow up.  This format is felt to be most appropriate for this patient at this time.  All issues noted in this document were discussed and addressed.  A limited physical exam was performed with this format.  Please refer to the patient's chart for his consent to telehealth for [facility].   Evaluation Performed:  Follow-up visit  Date:  06/12/2018   ID:  Randy, [name] 1955/12/12, MRN [id number]  Patient Location: Home Provider Location: Office  PCP:  Default, Provider, MD  Cardiologist:  Johnsie Cancel Electrophysiologist:  None   Chief Complaint:  Afib  History of Present Illness:    Randy [name] is a 63 y.o. male with HTN, CRF post transplant, DM, Thyroidectomy chronic afib and tremors He is on cyclosporine, myfortic and prednisone for his immunosuppression Data scan positive for Parkinsons Last office visit ECG read as afib but was in NSR and artifact from tremor read by computer as afib.  Unable to tolerate sinemet now on carbilol Sinemet made him dizzy. INR high 4 days ago so held a dose No bleeding issues Denies palpitations    The patient does not have symptoms concerning for COVID-19 infection (fever, chills, cough, or new shortness of breath).    Past Medical History:  Diagnosis Date  . Arthritis    knees  . Atrial fibrillation ([place])    dx 07/2012; CHADS2=2; coumadin started  . Chronic kidney disease    dialysis; kidney transplant  2004  . Diabetes ([place])   . Dyslipidemia   . Ejection fraction    a. Echo 7/14:  Mod LVH, EF 55-60%, Gr 2 DD, MAC, mild LAE, PASP 32, trivial Eff.  . Ejection fraction    .   Marland Kitchen History of verruca vulgaris   . Hypercalcemia   . Hyperlipidemia   . Hyperparathyroidism ([place])   . Hypertension   . Multiple thyroid nodules   . PAF (paroxysmal atrial fibrillation) ([place])   . 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 Preop cardiovascular exam    Preop clearance for thyroid surgery June, 2013  . Thyroid cancer ([place])   . Vitamin D deficiency   . Vitiligo    Past Surgical History:  Procedure Laterality Date  . KIDNEY TRANSPLANT  2004  . THYROIDECTOMY  08/17/2011   Procedure: THYROIDECTOMY;  Surgeon: Earnstine Regal, MD;  Location: [facility] ORS;  Service: General;  Laterality: N/A;     Current Meds  Medication Sig  . amLODipine (NORVASC) 10 MG tablet Take 10 mg by mouth 2 (two) times daily.  Marland Kitchen atorvastatin (LIPITOR) 80 MG tablet Take 80 mg by mouth daily.  . carbidopa-levodopa (SINEMET IR) 25-100 MG tablet Take 1 tablet by mouth 4 (four) times daily.  . cloNIDine (CATAPRES) 0.3 MG tablet Take 0.3 mg by mouth 2 (two) times daily.  . cycloSPORINE modified (NEORAL) 100 MG capsule Take 125 mg by mouth 2 (two) times daily.   . enalapril (VASOTEC) 5 MG tablet Take 5 mg by mouth at bedtime.   . furosemide (LASIX) 40 MG tablet Take 40 mg by mouth daily.  Marland Kitchen glipiZIDE (GLUCOTROL XL) 5 MG 24 hr tablet Take 5 mg by  mouth daily.  . metoprolol succinate (TOPROL-XL) 50 MG 24 hr tablet Take 1 tablet (50 mg total) by mouth daily. Take with or immediately following a meal.  . mycophenolate (MYFORTIC) 360 MG TBEC Take 720 mg by mouth 2 (two) times daily.   . predniSONE (DELTASONE) 5 MG tablet Take 5 mg by mouth daily.  Marland Kitchen SYNTHROID 100 MCG tablet Take 1 tablet by mouth daily.  Marland Kitchen warfarin (COUMADIN) 5 MG tablet USE AS DIRECTED BY  COUMADIN  CLINIC  . [DISCONTINUED] levothyroxine (SYNTHROID, LEVOTHROID) 112 MCG tablet Take 1 tablet by mouth daily.  . [DISCONTINUED] levothyroxine (SYNTHROID, LEVOTHROID) 137 MCG tablet Take 137 mcg by mouth daily before breakfast.     Allergies:   Patient  has no known allergies.   Social History   Tobacco Use  . Smoking status: Never Smoker  . Smokeless tobacco: Never Used  Substance Use Topics  . Alcohol use: No    Comment: no use since 2013  . Drug use: No    Types: Marijuana    Comment: previous use - not since 37     Family Hx: The patient's family history includes Dementia in his mother; Diabetes in his father and mother; Stroke in his father.  ROS:   Please see the history of present illness.     All other systems reviewed and are negative.   Prior CV studies:   The following studies were reviewed today:  Echo 08/23/12   Labs/Other Tests and Data Reviewed:    EKG:  SR rate 61 normal 07/02/17  Recent Labs: 07/01/2017: BUN 30; Creatinine, Ser 1.76; Hemoglobin 12.2; Platelets 201; Potassium 4.4; Sodium 140   Recent Lipid Panel No results found for: CHOL, TRIG, HDL, CHOLHDL, LDLCALC, LDLDIRECT  Wt Readings from Last 3 Encounters:  06/12/18 81.6 kg  10/19/17 78 kg  04/07/17 75 kg     Objective:    Vital Signs:  Ht _0  (1.88 m)   Wt 81.6 kg   BMI 23.11 kg/m    No physical done telephone visit   ASSESSMENT & PLAN:    CRF:  Post transplant f/u [place] kidney  Cr 1.38-1.8 stable  Thyroid: post surgery for cancer on synthroid replacement follow Calcium  PAF:  maint NSR f/u coumadin clinic no bleeding issues   HTN:  Well controlled.  Continue current medications and low sodium Dash type diet.    DM:  Discussed low carb diet.  Target hemoglobin A1c is 6.5 or less.  Continue current medications.  GI:  Post polyp removal 07/07/16 pathology benign f/u GI   Parkinson's f/u Dr Krista Blue neuro intolerant to sinemet Offered referral to Dr Tat for 2nd opinion if needed in future   F.u with me in a year coumadin clinic today   COVID-19 Education: The signs and symptoms of COVID-19 were discussed with the patient and how to seek care for testing (follow up with PCP or arrange E-visit).  The importance of social  distancing was discussed today.  Time:   Today, I have spent 30 minutes with the patient with telehealth technology discussing the above problems.     Medication Adjustments/Labs and Tests Ordered: Current medicines are reviewed at length with the patient today.  Concerns regarding medicines are outlined above.   Tests Ordered: No orders of the defined types were placed in this encounter.   Medication Changes: No orders of the defined types were placed in this encounter.   Disposition:  Follow up in a year  Signed,  Jenkins Rouge, MD  06/12/2018 9:50 AM    [place]

## 2018-06-09 NOTE — Telephone Encounter (Signed)
New message      Video appt made on 06-12-18.   Patient does not have an email address.

## 2018-06-12 ENCOUNTER — Encounter: Payer: Self-pay | Admitting: Cardiovascular Disease

## 2018-06-12 ENCOUNTER — Telehealth (INDEPENDENT_AMBULATORY_CARE_PROVIDER_SITE_OTHER): Payer: Medicare Other | Admitting: Cardiovascular Disease

## 2018-06-12 ENCOUNTER — Other Ambulatory Visit: Payer: Self-pay

## 2018-06-12 VITALS — Ht 74.0 in | Wt 180.0 lb

## 2018-06-12 DIAGNOSIS — I480 Paroxysmal atrial fibrillation: Secondary | ICD-10-CM | POA: Diagnosis not present

## 2018-06-12 NOTE — Patient Instructions (Addendum)
Medication Instructions:   If you need a refill on your cardiac medications before your next appointment, please call your pharmacy.   Lab work:  If you have labs (blood work) drawn today and your tests are completely normal, you will receive your results only by: Marland Kitchen MyChart Message (if you have MyChart) OR . A paper copy in the mail If you have any lab test that is abnormal or we need to change your treatment, we will call you to review the results.  Testing/Procedures: None ordered at this time.  Follow-Up: At [facility], you and your health needs are our priority.  As part of our continuing mission to provide you with exceptional heart care, we have created designated Provider Care Teams.  These Care Teams include your primary Cardiologist (physician) and Advanced Practice Providers (APPs -  Physician Assistants and Nurse Practitioners) who all work together to provide you with the care you need, when you need it. Your physician wants you to follow-up in: 6 months with Dr. Johnsie Cancel. You will receive a reminder letter in the mail two months in advance. If you don't receive a letter, please call our office to schedule the follow-up appointment.

## 2018-07-06 ENCOUNTER — Telehealth: Payer: Self-pay

## 2018-07-06 NOTE — Telephone Encounter (Signed)
Unable to get in contact with the patient to convert their office visit with Judson Roch on 07/11/2018 into a doxy.me visit. I left a voicemail asking the patient to return my call. Office number was provided.   If patient calls back please convert their office visit into a doxy.me visit.

## 2018-07-07 NOTE — Telephone Encounter (Signed)
Pt returned call and stated he does not have the technology that is needed for a Virtual Visit. Please advise.

## 2018-07-10 NOTE — Telephone Encounter (Signed)
I called the patient and left a message regarding his appointment tomorrow to discuss how he is doing and if he is comfortable coming into the office. I asked him to return my call. I read cardiology note from April 2020. It indicates he is not tolerating sinemet and may be seeing 2nd opinion from Dr. Carles Collet. Dr. Krista Blue may need to see this patient.

## 2018-07-10 NOTE — Telephone Encounter (Signed)
Noted! Thank you

## 2018-07-10 NOTE — Telephone Encounter (Signed)
Pt has called NP Sarah back, he is asking for a call back

## 2018-07-10 NOTE — Telephone Encounter (Signed)
I called the patient. He does not want to come into the office yet due to concerns about coronavirus. We will do a telephone visit tomorrow.

## 2018-07-11 ENCOUNTER — Other Ambulatory Visit: Payer: Self-pay

## 2018-07-11 ENCOUNTER — Encounter: Payer: Self-pay | Admitting: Neurology

## 2018-07-11 ENCOUNTER — Ambulatory Visit (INDEPENDENT_AMBULATORY_CARE_PROVIDER_SITE_OTHER): Payer: Medicare Other | Admitting: Neurology

## 2018-07-11 ENCOUNTER — Ambulatory Visit: Payer: Medicare Other | Admitting: Adult Health

## 2018-07-11 DIAGNOSIS — G20 Parkinson's disease: Secondary | ICD-10-CM | POA: Diagnosis not present

## 2018-07-11 MED ORDER — CARBIDOPA-LEVODOPA 25-100 MG PO TABS: 1.5000 | ORAL_TABLET | Freq: Four times a day (QID) | ORAL | 4 refills | Status: DC

## 2018-07-11 NOTE — Progress Notes (Signed)
Virtual Visit via Telephone Note  I connected with Randy [name] on 07/11/18 at 10:45 AM EDT by telephone and verified that I am speaking with the correct person using two identifiers.   I discussed the limitations, risks, security and privacy concerns of performing an evaluation and management service by telephone and the availability of in person appointments. I also discussed with the patient that there may be a patient responsible charge related to this service. The patient expressed understanding and agreed to proceed.  History of Present Illness: Randy [name] is a 63 year old male, accompanied by his sister Randy [name], seen in refer by  his primary care doctor Randy [name] for evaluation of tremor, initial evaluation November 04 2016.  I reviewed and summarized referring note, he had a history of diabetes, atrial fibrillation, on chronic Coumadin treatment, kidney transplant on November 09 2002,  thyroidectomy, on supplement, hypertension, hyperlipidemia,  He noted gradual onset tremor since 2017, initially affecting left foot, gradually getting worse, now involving both arm, he also noticed mild unsteady gait, stooped forward, tends to lose his balance, decreased arm swing,  He become less active, complains of weight loss over the past 1 year, he denied loss sense of smell, he sleeps well, no REM sleep disorder, no constipation  There is no family history of termor.  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  UPDATE Jan 05 2017: Data scan showed clear loss of dopamine transporter protein in bilateral straight out, most severe in the putamen, but also involving the caudate head,  left greater than right in the typical pattern of Parkinson syndrome.  MRI of the brain in August 2018 showed mild atrophy, chronic small vessel disease. MRI of cervical spine in September 2018 showed mild spondylitic disease, there was no significant canal or foraminal narrowing He is now taking Sinemet 25/100 mg at 8, noon, 7 PM,  UPDATE October 19 2017: He is accompanied by his sister at today's clinical visit, is taking Sinemet and 25/100 mg, take effect 20 minutes after empty stomach, and if it lasted 3 to 4 hours, early afternoon about 2-3 PM after second dose, he often feels exhausted, he denies difficulty turning in bed, he could not tolerate Requip due to GI side effect, dizziness.   Update Jul 11, 2018: He is taking Sinemet 25-100 4 times daily, 9 am, 12 pm, 2 pm, 4 pm.  He reports his strength diminishes, his muscles feel weak about 4:00 in the afternoon.  He reports in the morning he does feel well.  He has been trying to do some exercise with walking, using weights.  He does live with his sister, he drives a car.  He reports his appetite has been good, denies any trouble swallowing.  He reports that increasing the Sinemet to 4 times a day has helped some but he continues to have weakness in his legs in the late afternoon. He denies any falls or trouble with his walking.    Observations/Objective: Telephone call, is alert and oriented,  speech is clear and concise, good historian  Assessment and Plan: 1.  Parkinson's disease  He will increase his Sinemet 25-100, taking 1.5 tablets 4 times daily, try spacing out 9 am, 12 pm, 3 pm, 6 pm. He will continue moderate exercise. He will follow-up in the office to see Dr. Krista Blue in 2 months.   Follow Up  Instructions: 2 months in office with Dr. Krista Blue I have made an appointment with Dr. Krista Blue 09/12/2018 11:00 am.   I discussed the assessment and treatment plan with the patient. The patient was provided an opportunity to ask questions and all were answered. The patient agreed with the plan and demonstrated an understanding of the instructions.   The patient was advised to call back or seek an in-person evaluation if the symptoms worsen or if the condition fails to improve as anticipated.  I provided 30 minutes of non-face-to-face time during this encounter.   Evangeline Dakin, DNP  [facility] [address], [place], [place] [address] [phone]

## 2018-07-12 NOTE — Progress Notes (Signed)
I have reviewed and agreed above plan. 

## 2018-07-19 ENCOUNTER — Telehealth: Payer: Self-pay

## 2018-07-19 NOTE — Telephone Encounter (Signed)

## 2018-07-20 ENCOUNTER — Ambulatory Visit (INDEPENDENT_AMBULATORY_CARE_PROVIDER_SITE_OTHER): Payer: Medicare Other | Admitting: Pharmacist

## 2018-07-20 ENCOUNTER — Other Ambulatory Visit: Payer: Self-pay

## 2018-07-20 DIAGNOSIS — I482 Chronic atrial fibrillation, unspecified: Secondary | ICD-10-CM

## 2018-07-20 DIAGNOSIS — I4891 Unspecified atrial fibrillation: Secondary | ICD-10-CM

## 2018-07-20 DIAGNOSIS — Z5181 Encounter for therapeutic drug level monitoring: Secondary | ICD-10-CM

## 2018-07-20 LAB — POCT INR: INR: 2.3 (ref 2.0–3.0)

## 2018-07-27 DIAGNOSIS — I10 Essential (primary) hypertension: Secondary | ICD-10-CM | POA: Diagnosis not present

## 2018-07-27 DIAGNOSIS — Z0000 Encounter for general adult medical examination without abnormal findings: Secondary | ICD-10-CM | POA: Diagnosis not present

## 2018-07-27 DIAGNOSIS — Z85850 Personal history of malignant neoplasm of thyroid: Secondary | ICD-10-CM | POA: Diagnosis not present

## 2018-07-27 DIAGNOSIS — G20 Parkinson's disease: Secondary | ICD-10-CM | POA: Diagnosis not present

## 2018-07-27 DIAGNOSIS — E139 Other specified diabetes mellitus without complications: Secondary | ICD-10-CM | POA: Diagnosis not present

## 2018-07-27 DIAGNOSIS — Z940 Kidney transplant status: Secondary | ICD-10-CM | POA: Diagnosis not present

## 2018-07-27 DIAGNOSIS — E785 Hyperlipidemia, unspecified: Secondary | ICD-10-CM | POA: Diagnosis not present

## 2018-07-27 DIAGNOSIS — I480 Paroxysmal atrial fibrillation: Secondary | ICD-10-CM | POA: Diagnosis not present

## 2018-08-15 DIAGNOSIS — Z940 Kidney transplant status: Secondary | ICD-10-CM | POA: Diagnosis not present

## 2018-08-15 DIAGNOSIS — I10 Essential (primary) hypertension: Secondary | ICD-10-CM | POA: Diagnosis not present

## 2018-08-24 ENCOUNTER — Telehealth: Payer: Self-pay

## 2018-08-24 NOTE — Telephone Encounter (Signed)

## 2018-08-31 ENCOUNTER — Other Ambulatory Visit: Payer: Self-pay

## 2018-08-31 ENCOUNTER — Ambulatory Visit (INDEPENDENT_AMBULATORY_CARE_PROVIDER_SITE_OTHER): Payer: Medicare Other | Admitting: *Deleted

## 2018-08-31 DIAGNOSIS — I482 Chronic atrial fibrillation, unspecified: Secondary | ICD-10-CM | POA: Diagnosis not present

## 2018-08-31 DIAGNOSIS — Z5181 Encounter for therapeutic drug level monitoring: Secondary | ICD-10-CM

## 2018-08-31 LAB — POCT INR: INR: 2.9 (ref 2.0–3.0)

## 2018-08-31 NOTE — Patient Instructions (Signed)
Description   Continue taking 1 tablet everyday except 1/2 tablet on Wednesdays and Fridays.  Recheck in 6 weeks. Call us with any medication changes or concerns [phone] Coumadin Clinic, Main # [phone].

## 2018-09-12 ENCOUNTER — Ambulatory Visit (INDEPENDENT_AMBULATORY_CARE_PROVIDER_SITE_OTHER): Payer: Medicare Other | Admitting: Neurology

## 2018-09-12 ENCOUNTER — Other Ambulatory Visit: Payer: Self-pay

## 2018-09-12 ENCOUNTER — Encounter: Payer: Self-pay | Admitting: Neurology

## 2018-09-12 VITALS — BP 118/78 | HR 68 | Temp 96.2°F | Ht 74.0 in | Wt 195.0 lb

## 2018-09-12 DIAGNOSIS — G20 Parkinson's disease: Secondary | ICD-10-CM | POA: Diagnosis not present

## 2018-09-12 MED ORDER — CARBIDOPA-LEVODOPA 25-100 MG PO TABS: 2.0000 | ORAL_TABLET | Freq: Four times a day (QID) | ORAL | 4 refills | Status: DC

## 2018-09-12 NOTE — Progress Notes (Signed)
PATIENT: Randy [name] DOB: 1955-04-01  Chief Complaint  Patient presents with  . Parkinsonism/Gait abnormality    He is here with his sister, Jeani Hawking.  They would like to review his DatScan and MRI results.  Feels both Sinemet and PT have improved his symptoms.     HISTORICAL  Dontray Haberland is a 63 year old male, accompanied by his sister Jeani Hawking, seen in refer by  his primary care doctor Jamal Maes for evaluation of tremor, initial evaluation November 04 2016.  I reviewed and summarized referring note, he had a history of diabetes, atrial fibrillation, on chronic Coumadin treatment, kidney transplant on November 09 2002,  thyroidectomy, on supplement, hypertension, hyperlipidemia,  He noted gradual onset tremor since 2017, initially affecting left foot, gradually getting worse, now involving both arm, he also noticed mild unsteady gait, stooped forward, tends to lose his balance, decreased arm swing,  He become less active, complains of weight loss over the past 1 year, he denied loss sense of smell, he sleeps well, no REM sleep disorder, no constipation  There is no family history of termor.  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  UPDATE Jan 05 2017: Data scan showed clear loss of dopamine transporter protein in bilateral straight out, most severe in the putamen, but also involving the caudate head,  left greater than right in the typical pattern  of Parkinson syndrome.  MRI of the brain in August 2018 showed mild atrophy, chronic small vessel disease. MRI of cervical spine in September 2018 showed mild spondylitic disease, there was no significant canal or foraminal narrowing He is now taking Sinemet 25/100 mg at 8, noon, 7 PM,  UPDATE October 19 2017: He is accompanied by his sister at today's clinical visit, is taking Sinemet and 25/100 mg, take effect 20 minutes after empty stomach, and if it lasted 3 to 4 hours, early afternoon about 2-3 PM after second dose, he often feels exhausted, he denies difficulty turning in bed, he could not tolerate Requip due to GI side effect, dizziness.   UPDATE September 12 2018: He is taking Sinemet 25/100 mg 1 and half tablets 4 times a day at 8, 11, 15, 19, he noticed increased fatigue in the late afternoon especially 6 PM, he sleeps well, increase Sinemet from 1 to 1.5 tablets each time has made a difference, he noticed an dose wearing off, no dyskinesia  REVIEW OF SYSTEMS: Full 14 system review of systems performed and notable only for  As above.  ALLERGIES: No Known Allergies  HOME MEDICATIONS: Current Outpatient Medications  Medication Sig Dispense Refill  . amLODipine (NORVASC) 10 MG tablet Take 10 mg by mouth 2 (two) times daily.    Marland Kitchen atorvastatin (LIPITOR) 80 MG tablet Take 80 mg by mouth daily.    . carbidopa-levodopa (SINEMET IR) 25-100 MG tablet Take 1 tablet by mouth 3 (three) times daily. 90 tablet 6  . cloNIDine (CATAPRES) 0.3 MG tablet Take 0.3 mg by mouth 2 (two) times daily.    Marland Kitchen  cycloSPORINE modified (NEORAL) 100 MG capsule Take 125 mg by mouth 2 (two) times daily.     . enalapril (VASOTEC) 5 MG tablet Take 5 mg by mouth at bedtime.     . furosemide (LASIX) 40 MG tablet Take 40 mg by mouth daily.    Marland Kitchen glipiZIDE (GLUCOTROL XL) 5 MG 24 hr tablet Take 5 mg by mouth daily.    Marland Kitchen levothyroxine (SYNTHROID, LEVOTHROID) 137 MCG tablet Take 137 mcg by mouth daily before breakfast.    .  metoprolol succinate (TOPROL-XL) 50 MG 24 hr tablet Take 1 tablet (50 mg total) by mouth daily. Take with or immediately following a meal. 90 tablet 3  . mycophenolate (MYFORTIC) 360 MG TBEC Take 720 mg by mouth 2 (two) times daily.     . predniSONE (DELTASONE) 5 MG tablet Take 5 mg by mouth daily.    Marland Kitchen warfarin (COUMADIN) 5 MG tablet USE AS DIRECTED BY COUMADIN CLINIC 30 tablet 3   No current facility-administered medications for this visit.     PAST MEDICAL HISTORY: Past Medical History:  Diagnosis Date  . Arthritis    knees  . Atrial fibrillation ([place])    dx 07/2012; CHADS2=2; coumadin started  . Chronic kidney disease    dialysis; kidney transplant  2004  . Diabetes ([place])   . Dyslipidemia   . Ejection fraction    a. Echo 7/14:  Mod LVH, EF 55-60%, Gr 2 DD, MAC, mild LAE, PASP 32, trivial Eff.  . Ejection fraction    .  Marland Kitchen History of verruca vulgaris   . Hypercalcemia   . Hyperlipidemia   . Hyperparathyroidism ([place])   . Hypertension   . Multiple thyroid nodules   . PAF (paroxysmal atrial fibrillation) ([place])   . 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 Preop cardiovascular exam    Preop clearance for thyroid surgery June, 2013  . Thyroid cancer ([place])   . Vitamin D deficiency   . Vitiligo     PAST SURGICAL HISTORY: Past Surgical History:  Procedure Laterality Date  . KIDNEY TRANSPLANT  2004    FAMILY HISTORY: Family History  Problem Relation Age of Onset  . Dementia Mother   . Diabetes Mother   . Stroke Father   . Diabetes Father     SOCIAL HISTORY:  Social History   Socioeconomic History  . Marital status: Single    Spouse name: Not on file  . Number of children: 2  . Years of education: HS  . Highest education level: Not on file  Social Needs  . Financial resource strain: Not on file  . Food insecurity - worry: Not on file  . Food insecurity - inability: Not on file  . Transportation needs - medical: Not on file  . Transportation needs -  non-medical: Not on file  Occupational History  . Occupation: Disabled  Tobacco Use  . Smoking status: Never Smoker  . Smokeless tobacco: Never Used  Substance and Sexual Activity  . Alcohol use: No    Comment: no use since 2013  . Drug use: No    Comment: previous use - not since 1993  . Sexual activity: Not on file  Other Topics Concern  . Not on file  Social History Narrative   Lives at home alone.   Right-handed.   12 ounces of caffeine weekly.     PHYSICAL EXAM   Vitals:   01/05/17 1004  BP: 112/77  Pulse: 74  Weight: 168 lb (76.2 kg)  Height: _0  (1.88 m)    Not recorded      Body mass index is 21.57 kg/m.  PHYSICAL EXAMNIATION:  Gen: NAD, conversant, well nourised, obese, well groomed                     Cardiovascular: Regular rate rhythm, no peripheral edema, warm, nontender. Eyes: Conjunctivae clear without exudates or hemorrhage Neck: Supple, no carotid bruits. Pulmonary: Clear to auscultation bilaterally   NEUROLOGICAL EXAM:  MENTAL STATUS: Speech:    Speech is normal; fluent and spontaneous with normal comprehension.  Cognition:     Orientation to time, place and person     Normal recent and remote memory     Normal Attention span and concentration     Normal Language, naming, repeating,spontaneous speech     Fund of knowledge   CRANIAL NERVES: CN II: Visual fields are full to confrontation.  Pupils are round equal and briskly reactive to light. CN III, IV, VI: extraocular movement are normal. No ptosis. CN V: Facial sensation is intact to pinprick in all 3 divisions bilaterally. Corneal responses are intact.  CN VII: Face is symmetric with normal eye closure and smile. CN VIII: Hearing is normal to rubbing fingers CN IX, X: Palate elevates symmetrically. Phonation is normal. CN XI: Head turning and shoulder shrug are intact CN XII: Tongue is midline with normal movements and no atrophy.  MOTOR: Mild left arm and leg resting tremor,  mild left more than right rigidity, bradykinesia, no weakness.  REFLEXES: Reflexes are 3 and symmetric at the biceps, triceps, knees, and ankles. Plantar responses are extensor bilaterally.  SENSORY: Intact to light touch, pinprick, positional sensation and vibratory sensation are intact in fingers and toes.  COORDINATION: Rapid alternating movements and fine finger movements are intact. There is no dysmetria on finger-to-nose and heel-knee-shin.    GAIT/STANCE: He needs push up to get up from seated position, stoop forward, mildly unsteady, cautious, decreased bilateral arm swing,   DIAGNOSTIC DATA (LABS, IMAGING, TESTING) - I reviewed patient records, labs, notes, testing and imaging myself where available.   ASSESSMENT AND PLAN  Maksim Peregoy is a 63 y.o. male   Idiopathic Parkinson's disease  DatScan showed decreased dopamine transporter protein at bilateral putamen, also involving caudate head, left worse than right,  May increase Sinemet 25/100 to 2 tablets four times a day at 8, 12, 16, 19  Could not tolerate requip due to dizziness, GI side effect,has high copay for requip xr.  Keep moderate exercise  Marcial Pacas, M.D. Ph.D.  [facility] [address] [place], [place] [address] Ph: [phone] Fax: [phone]  CC: Jamal Maes, MD

## 2018-09-20 ENCOUNTER — Other Ambulatory Visit: Payer: Self-pay | Admitting: Cardiovascular Disease

## 2018-10-12 ENCOUNTER — Other Ambulatory Visit: Payer: Self-pay

## 2018-10-12 ENCOUNTER — Ambulatory Visit (INDEPENDENT_AMBULATORY_CARE_PROVIDER_SITE_OTHER): Payer: Medicare Other | Admitting: *Deleted

## 2018-10-12 DIAGNOSIS — Z5181 Encounter for therapeutic drug level monitoring: Secondary | ICD-10-CM | POA: Diagnosis not present

## 2018-10-12 DIAGNOSIS — I482 Chronic atrial fibrillation, unspecified: Secondary | ICD-10-CM

## 2018-10-12 LAB — POCT INR: INR: 2.2 (ref 2.0–3.0)

## 2018-10-12 NOTE — Patient Instructions (Signed)
Description   Continue taking 1 tablet everyday except 1/2 tablet on Wednesdays and Fridays.  Recheck in 6 weeks. Call us with any medication changes or concerns [phone] Coumadin Clinic, Main # [phone].

## 2018-10-31 DIAGNOSIS — Z85850 Personal history of malignant neoplasm of thyroid: Secondary | ICD-10-CM | POA: Diagnosis not present

## 2018-10-31 DIAGNOSIS — G20 Parkinson's disease: Secondary | ICD-10-CM | POA: Diagnosis not present

## 2018-10-31 DIAGNOSIS — E785 Hyperlipidemia, unspecified: Secondary | ICD-10-CM | POA: Diagnosis not present

## 2018-10-31 DIAGNOSIS — Z940 Kidney transplant status: Secondary | ICD-10-CM | POA: Diagnosis not present

## 2018-10-31 DIAGNOSIS — E139 Other specified diabetes mellitus without complications: Secondary | ICD-10-CM | POA: Diagnosis not present

## 2018-10-31 DIAGNOSIS — I480 Paroxysmal atrial fibrillation: Secondary | ICD-10-CM | POA: Diagnosis not present

## 2018-10-31 DIAGNOSIS — Z0000 Encounter for general adult medical examination without abnormal findings: Secondary | ICD-10-CM | POA: Diagnosis not present

## 2018-10-31 DIAGNOSIS — D631 Anemia in chronic kidney disease: Secondary | ICD-10-CM | POA: Diagnosis not present

## 2018-10-31 DIAGNOSIS — I10 Essential (primary) hypertension: Secondary | ICD-10-CM | POA: Diagnosis not present

## 2018-10-31 DIAGNOSIS — R609 Edema, unspecified: Secondary | ICD-10-CM | POA: Diagnosis not present

## 2018-11-23 ENCOUNTER — Ambulatory Visit (INDEPENDENT_AMBULATORY_CARE_PROVIDER_SITE_OTHER): Payer: Medicare Other | Admitting: *Deleted

## 2018-11-23 ENCOUNTER — Other Ambulatory Visit: Payer: Self-pay

## 2018-11-23 DIAGNOSIS — Z5181 Encounter for therapeutic drug level monitoring: Secondary | ICD-10-CM | POA: Diagnosis not present

## 2018-11-23 DIAGNOSIS — I482 Chronic atrial fibrillation, unspecified: Secondary | ICD-10-CM | POA: Diagnosis not present

## 2018-11-23 LAB — POCT INR: INR: 1.9 — AB (ref 2.0–3.0)

## 2018-11-23 NOTE — Patient Instructions (Signed)
Description   Today take 1.5 tablets then continue taking 1 tablet everyday except 1/2 tablet on Wednesdays and Fridays.  Recheck in 6 weeks. Call us with any medication changes or concerns [phone] Coumadin Clinic, Main # [phone].

## 2019-01-21 ENCOUNTER — Other Ambulatory Visit: Payer: Self-pay | Admitting: Cardiovascular Disease

## 2019-01-26 ENCOUNTER — Ambulatory Visit (INDEPENDENT_AMBULATORY_CARE_PROVIDER_SITE_OTHER): Payer: Medicare Other | Admitting: *Deleted

## 2019-01-26 ENCOUNTER — Other Ambulatory Visit: Payer: Self-pay

## 2019-01-26 DIAGNOSIS — I482 Chronic atrial fibrillation, unspecified: Secondary | ICD-10-CM

## 2019-01-26 DIAGNOSIS — Z5181 Encounter for therapeutic drug level monitoring: Secondary | ICD-10-CM | POA: Diagnosis not present

## 2019-01-26 LAB — POCT INR: INR: 2.3 (ref 2.0–3.0)

## 2019-01-26 NOTE — Patient Instructions (Addendum)
Description   Continue taking 1 tablet everyday except 1/2 tablet on Wednesdays and Fridays.  Recheck in 8 weeks. Call us with any medication changes or concerns [phone] [facility], Main # [phone].

## 2019-02-20 ENCOUNTER — Other Ambulatory Visit: Payer: Self-pay | Admitting: Cardiovascular Disease

## 2019-03-20 ENCOUNTER — Ambulatory Visit (INDEPENDENT_AMBULATORY_CARE_PROVIDER_SITE_OTHER): Payer: Medicare Other | Admitting: Neurology

## 2019-03-20 ENCOUNTER — Other Ambulatory Visit: Payer: Self-pay

## 2019-03-20 ENCOUNTER — Encounter: Payer: Self-pay | Admitting: Neurology

## 2019-03-20 VITALS — BP 131/82 | HR 80 | Temp 97.4°F | Ht 74.0 in | Wt 209.5 lb

## 2019-03-20 DIAGNOSIS — G20 Parkinson's disease: Secondary | ICD-10-CM | POA: Diagnosis not present

## 2019-03-20 DIAGNOSIS — G20A1 Parkinson's disease without dyskinesia, without mention of fluctuations: Secondary | ICD-10-CM

## 2019-03-20 NOTE — Progress Notes (Signed)
PATIENT: Randy [name] DOB: June 06, 1955  Chief Complaint  Patient presents with  . Parkinsonism/Gait abnormality    He is here with his sister, Randy [name].  They would like to review his DatScan and MRI results.  Feels both Sinemet and PT have improved his symptoms.     HISTORICAL  Randy [name] is a 64 year old male, accompanied by his sister Randy [name], seen in refer by  his primary care doctor Jamal Maes for evaluation of tremor, initial evaluation November 04 2016.  I reviewed and summarized referring note, he had a history of diabetes, atrial fibrillation, on chronic Coumadin treatment, kidney transplant on November 09 2002,  thyroidectomy, on supplement, hypertension, hyperlipidemia,  He noted gradual onset tremor since 2017, initially affecting left foot, gradually getting worse, now involving both arm, he also noticed mild unsteady gait, stooped forward, tends to lose his balance, decreased arm swing,  He become less active, complains of weight loss over the past 1 year, he denied loss sense of smell, he sleeps well, no REM sleep disorder, no constipation  There is no family history of termor.  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  UPDATE Jan 05 2017: Data scan showed clear loss of dopamine transporter protein in bilateral straight out, most severe in the putamen, but also involving the caudate head,  left greater than right in the typical pattern  of Parkinson syndrome.  MRI of the brain in August 2018 showed mild atrophy, chronic small vessel disease. MRI of cervical spine in September 2018 showed mild spondylitic disease, there was no significant canal or foraminal narrowing He is now taking Sinemet 25/100 mg at 8, noon, 7 PM,  UPDATE October 19 2017: He is accompanied by his sister at today's clinical visit, is taking Sinemet and 25/100 mg, take effect 20 minutes after empty stomach, and if it lasted 3 to 4 hours, early afternoon about 2-3 PM after second dose, he often feels exhausted, he denies difficulty turning in bed, he could not tolerate Requip due to GI side effect, dizziness.   UPDATE September 12 2018: He is taking Sinemet 25/100 mg 1 and half tablets 4 times a day at 8, 11, 15, 27, he noticed increased fatigue in the late afternoon especially 6 PM, he sleeps well, increase Sinemet from 1 to 1.5 tablets each time has made a difference, he noticed an dose wearing off, no dyskinesia  Update March 20, 2019: Follow-up for idiopathic Parkinson's disease, He is overall doing well, tolerating Sinemet 25/100 mg 2 tablets, he takes it every 2-3 hours, note is wearing off bradykinesia, he did not reported dyskinesia, he is happy about current medication, wants to stay on current schedule  REVIEW OF SYSTEMS: Full 14 system review of systems performed and notable only for  As above.  ALLERGIES: No Known Allergies  HOME MEDICATIONS: Current Outpatient Medications  Medication Sig Dispense Refill  . amLODipine (NORVASC) 10 MG tablet Take 10 mg by mouth 2 (two) times daily.    Marland Kitchen atorvastatin (LIPITOR) 80 MG  tablet Take 80 mg by mouth daily.    . carbidopa-levodopa (SINEMET IR) 25-100 MG tablet Take 1 tablet by mouth 3 (three) times daily. 90 tablet 6  . cloNIDine (CATAPRES) 0.3 MG tablet Take 0.3 mg by mouth 2 (two) times daily.    . cycloSPORINE modified (NEORAL) 100 MG capsule Take 125 mg by mouth 2 (two) times daily.     . enalapril  (VASOTEC) 5 MG tablet Take 5 mg by mouth at bedtime.     . furosemide (LASIX) 40 MG tablet Take 40 mg by mouth daily.    Marland Kitchen glipiZIDE (GLUCOTROL XL) 5 MG 24 hr tablet Take 5 mg by mouth daily.    Marland Kitchen levothyroxine (SYNTHROID, LEVOTHROID) 137 MCG tablet Take 137 mcg by mouth daily before breakfast.    . metoprolol succinate (TOPROL-XL) 50 MG 24 hr tablet Take 1 tablet (50 mg total) by mouth daily. Take with or immediately following a meal. 90 tablet 3  . mycophenolate (MYFORTIC) 360 MG TBEC Take 720 mg by mouth 2 (two) times daily.     . predniSONE (DELTASONE) 5 MG tablet Take 5 mg by mouth daily.    Marland Kitchen warfarin (COUMADIN) 5 MG tablet USE AS DIRECTED BY COUMADIN CLINIC 30 tablet 3   No current facility-administered medications for this visit.     PAST MEDICAL HISTORY: Past Medical History:  Diagnosis Date  . Arthritis    knees  . Atrial fibrillation ([place])    dx 07/2012; CHADS2=2; coumadin started  . Chronic kidney disease    dialysis; kidney transplant  2004  . Diabetes ([place])   . Dyslipidemia   . Ejection fraction    a. Echo 7/14:  Mod LVH, EF 55-60%, Gr 2 DD, MAC, mild LAE, PASP 32, trivial Eff.  . Ejection fraction    .  Marland Kitchen History of verruca vulgaris   . Hypercalcemia   . Hyperlipidemia   . Hyperparathyroidism ([place])   . Hypertension   . Multiple thyroid nodules   . PAF (paroxysmal atrial fibrillation) ([place])   . 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 Preop cardiovascular exam    Preop clearance for thyroid surgery June, 2013  . Thyroid cancer ([place])   . Vitamin D deficiency   . Vitiligo     PAST SURGICAL HISTORY: Past Surgical History:  Procedure Laterality Date  . KIDNEY TRANSPLANT  2004    FAMILY HISTORY: Family History  Problem Relation Age of Onset  . Dementia Mother   . Diabetes Mother   . Stroke Father   . Diabetes Father     SOCIAL HISTORY:  Social History   Socioeconomic History  . Marital status: Single    Spouse name: Not on file  . Number of  children: 2  . Years of education: HS  . Highest education level: Not on file  Social Needs  . Financial resource strain: Not on file  . Food insecurity - worry: Not on file  . Food insecurity - inability: Not on file  . Transportation needs - medical: Not on file  . Transportation needs - non-medical: Not on file  Occupational History  . Occupation: Disabled  Tobacco Use  . Smoking status: Never Smoker  . Smokeless tobacco: Never Used  Substance and Sexual Activity  . Alcohol use: No    Comment: no use since 2013  . Drug use: No    Comment: previous use - not since 1993  . Sexual activity: Not on file  Other Topics Concern  .  Not on file  Social History Narrative   Lives at home alone.   Right-handed.   12 ounces of caffeine weekly.     PHYSICAL EXAM   Vitals:   01/05/17 1004  BP: 112/77  Pulse: 74  Weight: 168 lb (76.2 kg)  Height: _0  (1.88 m)    Not recorded      Body mass index is 21.57 kg/m.  PHYSICAL EXAMNIATION:  Gen: NAD, conversant, well nourised, obese, well groomed                     Cardiovascular: Regular rate rhythm, no peripheral edema, warm, nontender. Eyes: Conjunctivae clear without exudates or hemorrhage Neck: Supple, no carotid bruits. Pulmonary: Clear to auscultation bilaterally   NEUROLOGICAL EXAM:  MENTAL STATUS: Speech:    Speech is normal; fluent and spontaneous with normal comprehension.  Cognition:     Orientation to time, place and person     Normal recent and remote memory     Normal Attention span and concentration     Normal Language, naming, repeating,spontaneous speech     Fund of knowledge   CRANIAL NERVES: CN II: Visual fields are full to confrontation.  Pupils are round equal and briskly reactive to light. CN III, IV, VI: extraocular movement are normal. No ptosis. CN V: Facial sensation is intact to pinprick in all 3 divisions bilaterally. Corneal responses are intact.  CN VII: Face is symmetric with normal  eye closure and smile. CN VIII: Hearing is normal to rubbing fingers CN IX, X: Palate elevates symmetrically. Phonation is normal. CN XI: Head turning and shoulder shrug are intact CN XII: Tongue is midline with normal movements and no atrophy.  MOTOR: Mild left arm and leg resting tremor, mild left more than right rigidity, bradykinesia, no weakness.  REFLEXES: Reflexes are 3 and symmetric at the biceps, triceps, knees, and ankles. Plantar responses are extensor bilaterally.  SENSORY: Intact to light touch, pinprick, positional sensation and vibratory sensation are intact in fingers and toes.  COORDINATION: Rapid alternating movements and fine finger movements are intact. There is no dysmetria on finger-to-nose and heel-knee-shin.    GAIT/STANCE: He needs push up to get up from seated position, stoop forward, mildly unsteady, cautious, decreased bilateral arm swing,   DIAGNOSTIC DATA (LABS, IMAGING, TESTING) - I reviewed patient records, labs, notes, testing and imaging myself where available.   ASSESSMENT AND PLAN  Akia Desroches is a 64 y.o. male   Idiopathic Parkinson's disease  DatScan showed decreased dopamine transporter protein at bilateral putamen, also involving caudate head, left worse than right,  Keep Sinemet 25/100 to 2 tablets four times a day at 8, 12, 16, 19  Could not tolerate requip due to dizziness, GI side effect,has high copay for requip xr.  Keep moderate exercise  Return to clinic in 6 months  Marcial Pacas, M.D. Ph.D.  [facility] [address] [place], [place] [address] Ph: [phone] Fax: [phone]  CC: Jamal Maes, MD

## 2019-03-22 ENCOUNTER — Other Ambulatory Visit: Payer: Self-pay

## 2019-03-22 ENCOUNTER — Ambulatory Visit (INDEPENDENT_AMBULATORY_CARE_PROVIDER_SITE_OTHER): Payer: Medicare Other

## 2019-03-22 DIAGNOSIS — Z5181 Encounter for therapeutic drug level monitoring: Secondary | ICD-10-CM | POA: Diagnosis not present

## 2019-03-22 DIAGNOSIS — I482 Chronic atrial fibrillation, unspecified: Secondary | ICD-10-CM | POA: Diagnosis not present

## 2019-03-22 DIAGNOSIS — E1165 Type 2 diabetes mellitus with hyperglycemia: Secondary | ICD-10-CM | POA: Diagnosis not present

## 2019-03-22 DIAGNOSIS — C73 Malignant neoplasm of thyroid gland: Secondary | ICD-10-CM | POA: Diagnosis not present

## 2019-03-22 DIAGNOSIS — I10 Essential (primary) hypertension: Secondary | ICD-10-CM | POA: Diagnosis not present

## 2019-03-22 DIAGNOSIS — E890 Postprocedural hypothyroidism: Secondary | ICD-10-CM | POA: Diagnosis not present

## 2019-03-22 LAB — POCT INR: INR: 2.2 (ref 2.0–3.0)

## 2019-03-22 NOTE — Patient Instructions (Signed)
Description   Continue on same dosage 1 tablet everyday except 1/2 tablet on Wednesdays and Fridays.  Recheck in 8 weeks. Call us with any medication changes or concerns #[phone] [facility], Main # [phone].     

## 2019-03-23 ENCOUNTER — Other Ambulatory Visit: Payer: Self-pay | Admitting: Cardiovascular Disease

## 2019-05-03 DIAGNOSIS — D631 Anemia in chronic kidney disease: Secondary | ICD-10-CM | POA: Diagnosis not present

## 2019-05-03 DIAGNOSIS — Z940 Kidney transplant status: Secondary | ICD-10-CM | POA: Diagnosis not present

## 2019-05-03 DIAGNOSIS — I10 Essential (primary) hypertension: Secondary | ICD-10-CM | POA: Diagnosis not present

## 2019-05-03 DIAGNOSIS — N189 Chronic kidney disease, unspecified: Secondary | ICD-10-CM | POA: Diagnosis not present

## 2019-05-03 DIAGNOSIS — N2581 Secondary hyperparathyroidism of renal origin: Secondary | ICD-10-CM | POA: Diagnosis not present

## 2019-05-17 ENCOUNTER — Other Ambulatory Visit: Payer: Self-pay

## 2019-05-17 ENCOUNTER — Ambulatory Visit (INDEPENDENT_AMBULATORY_CARE_PROVIDER_SITE_OTHER): Payer: Medicare Other

## 2019-05-17 DIAGNOSIS — I482 Chronic atrial fibrillation, unspecified: Secondary | ICD-10-CM | POA: Diagnosis not present

## 2019-05-17 DIAGNOSIS — Z5181 Encounter for therapeutic drug level monitoring: Secondary | ICD-10-CM | POA: Diagnosis not present

## 2019-05-17 LAB — POCT INR: INR: 2.2 (ref 2.0–3.0)

## 2019-05-17 NOTE — Patient Instructions (Signed)
Description   Continue on same dosage 1 tablet everyday except 1/2 tablet on Wednesdays and Fridays.  Recheck in 8 weeks. Call us with any medication changes or concerns #[phone] [facility], Main # [phone].     

## 2019-05-24 DIAGNOSIS — I10 Essential (primary) hypertension: Secondary | ICD-10-CM | POA: Diagnosis not present

## 2019-05-24 DIAGNOSIS — E890 Postprocedural hypothyroidism: Secondary | ICD-10-CM | POA: Diagnosis not present

## 2019-06-07 NOTE — Progress Notes (Signed)
Evaluation Performed:  Follow-up visit  Date:  06/14/2019   ID:  Randy [name], DOB 1955-04-01, MRN [id number]   PCP:  Default, Provider, MD  Cardiologist:  Johnsie Cancel Electrophysiologist:  None   Chief Complaint:  Afib  History of Present Illness:    Randy [name] is a 64 y.o. male with HTN, CRF post transplant, DM, Thyroidectomy chronic afib and tremors He is on cyclosporine, myfortic and prednisone for his immunosuppression Data scan positive for Parkinsons Last office visit ECG read as afib but was in NSR and artifact from tremor read by computer as afib.  Unable to tolerate sinemet now on carbilol Sinemet made him dizzy.  INR been Rx last being 05/17/19 2.2 with no bleeding issues  Has had vaccine Seeing [facility]el for renal  No cardiac symptoms In NSR office today INR;s have been Rx  Takes his Parkinson's meds every 2-3 hours or he gets shaky  The patient does not have symptoms concerning for COVID-19 infection (fever, chills, cough, or new shortness of breath).    Past Medical History:  Diagnosis Date  . Arthritis    knees  . Atrial fibrillation ([place])    dx 07/2012; CHADS2=2; coumadin started  . Chronic kidney disease    dialysis; kidney transplant  2004  . Diabetes ([place])   . Dyslipidemia   . Ejection fraction    a. Echo 7/14:  Mod LVH, EF 55-60%, Gr 2 DD, MAC, mild LAE, PASP 32, trivial Eff.  . Ejection fraction    .  Marland Kitchen History of verruca vulgaris   . Hypercalcemia   . Hyperlipidemia   . Hyperparathyroidism ([place])   . Hypertension   . Multiple thyroid nodules   . PAF (paroxysmal atrial fibrillation) ([place])   . 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 Preop cardiovascular exam    Preop clearance for thyroid surgery June, 2013  . Thyroid cancer ([place])   . Vitamin D deficiency   . Vitiligo    Past Surgical History:  Procedure Laterality Date  . KIDNEY TRANSPLANT  2004  . THYROIDECTOMY  08/17/2011   Procedure: THYROIDECTOMY;  Surgeon: Earnstine Regal, MD;   Location: [facility];  Service: General;  Laterality: N/A;     Current Meds  Medication Sig  . amLODipine (NORVASC) 10 MG tablet Take 10 mg by mouth 2 (two) times daily.  Marland Kitchen atorvastatin (LIPITOR) 80 MG tablet Take 80 mg by mouth daily.  . carbidopa-levodopa (SINEMET IR) 25-100 MG tablet Take 2 tablets by mouth 4 (four) times daily.  . cloNIDine (CATAPRES) 0.3 MG tablet Take 0.3 mg by mouth 2 (two) times daily.  . cycloSPORINE modified (NEORAL) 100 MG capsule Take 125 mg by mouth 2 (two) times daily.   . furosemide (LASIX) 40 MG tablet Take 40 mg by mouth daily.  Marland Kitchen glipiZIDE (GLUCOTROL XL) 5 MG 24 hr tablet Take 5 mg by mouth daily.  . metoprolol succinate (TOPROL-XL) 50 MG 24 hr tablet Take 1 tablet (50 mg total) by mouth daily. Take with or immediately following a meal.  . mycophenolate (MYFORTIC) 360 MG TBEC Take 720 mg by mouth 2 (two) times daily.   . predniSONE (DELTASONE) 5 MG tablet Take 5 mg by mouth daily.  Marland Kitchen warfarin (COUMADIN) 5 MG tablet TAKE 1/2 TO 1 (ONE-HALF TO ONE) TABLET BY MOUTH ONCE DAILY AS DIRECTED BY  COUMADIN  CLINIC  . [DISCONTINUED] levothyroxine (SYNTHROID) 112 MCG tablet Take 112 mcg by mouth daily before breakfast.  Allergies:   Patient has no known allergies.   Social History   Tobacco Use  . Smoking status: Never Smoker  . Smokeless tobacco: Never Used  Substance Use Topics  . Alcohol use: No    Comment: no use since 2013  . Drug use: No    Types: Marijuana    Comment: previous use - not since 54     Family Hx: The patient's family history includes Dementia in his mother; Diabetes in his father and mother; Stroke in his father.  ROS:   Please see the history of present illness.     All other systems reviewed and are negative.   Prior CV studies:   The following studies were reviewed today:  Echo 08/23/12   Labs/Other Tests and Data Reviewed:    EKG:  SR rate 61 normal 07/02/17 06/14/19 SR PAC nonspecific ST changes rate 54  Recent  Labs: No results found for requested labs within last 8760 hours.   Recent Lipid Panel No results found for: CHOL, TRIG, HDL, CHOLHDL, LDLCALC, LDLDIRECT  Wt Readings from Last 3 Encounters:  06/14/19 213 lb (96.6 kg)  03/20/19 209 lb 8 oz (95 kg)  09/12/18 195 lb (88.5 kg)     Objective:    Vital Signs:  BP 132/88   Pulse 81   Ht _0  (1.88 m)   Wt 213 lb (96.6 kg)   SpO2 99%   BMI 27.35 kg/m    Affect appropriate Healthy:  appears stated age 18: post thyroidectomy  Neck supple with no adenopathy JVP normal no bruits no thyromegaly Lungs clear with no wheezing and good diaphragmatic motion Heart:  S1/S2 no murmur, no rub, gallop or click PMI normal Abdomen: benighn, post renal transplant in [place]Q Distal pulses intact with no bruits No edema Neuro non-focal Skin warm and dry No muscular weakness   ASSESSMENT & PLAN:    CRF:  Post transplant f/u [place] kidney  Cr 1.38-1.8 stable  Thyroid: post surgery for cancer on synthroid replacement follow Calcium  PAF:  maint NSR f/u coumadin clinic no bleeding issues   HTN:  Well controlled.  Continue current medications and low sodium Dash type diet.    DM:  Discussed low carb diet.  Target hemoglobin A1c is 6.5 or less.  Continue current medications.  GI:  Post polyp removal 07/07/16 pathology benign f/u GI   Parkinson's f/u Dr Krista Blue neuro intolerant to sinemet Offered referral to Dr Tat for 2nd opinion if needed in future   F.u with me in a year        Medication Adjustments/Labs and Tests Ordered: Current medicines are reviewed at length with the patient today.  Concerns regarding medicines are outlined above.   Tests Ordered: Orders Placed This Encounter  Procedures  . EKG 12-Lead    Medication Changes: No orders of the defined types were placed in this encounter.   Disposition:  Follow up in a year  Signed, Jenkins Rouge, MD  06/14/2019 9:44 AM    [place]

## 2019-06-14 ENCOUNTER — Other Ambulatory Visit: Payer: Self-pay

## 2019-06-14 ENCOUNTER — Encounter: Payer: Self-pay | Admitting: Cardiovascular Disease

## 2019-06-14 ENCOUNTER — Ambulatory Visit (INDEPENDENT_AMBULATORY_CARE_PROVIDER_SITE_OTHER): Payer: Medicare Other | Admitting: Cardiovascular Disease

## 2019-06-14 VITALS — BP 132/88 | HR 81 | Ht 74.0 in | Wt 213.0 lb

## 2019-06-14 DIAGNOSIS — I480 Paroxysmal atrial fibrillation: Secondary | ICD-10-CM

## 2019-06-14 NOTE — Patient Instructions (Signed)
Medication Instructions:  *If you need a refill on your cardiac medications before your next appointment, please call your pharmacy*  Lab Work: If you have labs (blood work) drawn today and your tests are completely normal, you will receive your results only by: . MyChart Message (if you have MyChart) OR . A paper copy in the mail If you have any lab test that is abnormal or we need to change your treatment, we will call you to review the results.  Follow-Up: At [facility], you and your health needs are our priority.  As part of our continuing mission to provide you with exceptional heart care, we have created designated Provider Care Teams.  These Care Teams include your primary Cardiologist (physician) and Advanced Practice Providers (APPs -  Physician Assistants and Nurse Practitioners) who all work together to provide you with the care you need, when you need it.  We recommend signing up for the patient portal called "MyChart".  Sign up information is provided on this After Visit Summary.  MyChart is used to connect with patients for Virtual Visits (Telemedicine).  Patients are able to view lab/test results, encounter notes, upcoming appointments, etc.  Non-urgent messages can be sent to your provider as well.   To learn more about what you can do with MyChart, go to [email or web address].    Your next appointment:   1 year(s)  The format for your next appointment:   In Person  Provider:   You may see Dr. Nishan or one of the following Advanced Practice Providers on your designated Care Team:    Lori Gerhardt, NP  Laura Ingold, NP  Jill McDaniel, NP     

## 2019-07-02 ENCOUNTER — Other Ambulatory Visit: Payer: Self-pay | Admitting: Cardiovascular Disease

## 2019-07-12 ENCOUNTER — Ambulatory Visit (INDEPENDENT_AMBULATORY_CARE_PROVIDER_SITE_OTHER): Payer: Medicare Other

## 2019-07-12 ENCOUNTER — Other Ambulatory Visit: Payer: Self-pay

## 2019-07-12 DIAGNOSIS — Z5181 Encounter for therapeutic drug level monitoring: Secondary | ICD-10-CM | POA: Diagnosis not present

## 2019-07-12 DIAGNOSIS — I482 Chronic atrial fibrillation, unspecified: Secondary | ICD-10-CM | POA: Diagnosis not present

## 2019-07-12 LAB — POCT INR: INR: 2.2 (ref 2.0–3.0)

## 2019-07-12 NOTE — Patient Instructions (Signed)
Description   Continue on same dosage 1 tablet everyday except 1/2 tablet on Wednesdays and Fridays.  Recheck in 8 weeks. Call us with any medication changes or concerns #[phone] [facility], Main # [phone].     

## 2019-09-03 DIAGNOSIS — N2581 Secondary hyperparathyroidism of renal origin: Secondary | ICD-10-CM | POA: Diagnosis not present

## 2019-09-03 DIAGNOSIS — Z940 Kidney transplant status: Secondary | ICD-10-CM | POA: Diagnosis not present

## 2019-09-06 ENCOUNTER — Ambulatory Visit (INDEPENDENT_AMBULATORY_CARE_PROVIDER_SITE_OTHER): Payer: Medicare Other | Admitting: Pharmacist

## 2019-09-06 ENCOUNTER — Other Ambulatory Visit: Payer: Self-pay

## 2019-09-06 DIAGNOSIS — I482 Chronic atrial fibrillation, unspecified: Secondary | ICD-10-CM | POA: Diagnosis not present

## 2019-09-06 DIAGNOSIS — Z5181 Encounter for therapeutic drug level monitoring: Secondary | ICD-10-CM

## 2019-09-06 LAB — POCT INR: INR: 2.1 (ref 2.0–3.0)

## 2019-09-06 NOTE — Patient Instructions (Signed)
Description   Continue on same dosage 1 tablet everyday except 1/2 tablet on Wednesdays and Fridays.  Recheck in 8 weeks. Call us with any medication changes or concerns #[phone] [facility], Main # [phone].     

## 2019-09-20 ENCOUNTER — Ambulatory Visit (INDEPENDENT_AMBULATORY_CARE_PROVIDER_SITE_OTHER): Payer: Medicare Other | Admitting: Neurology

## 2019-09-20 ENCOUNTER — Ambulatory Visit: Payer: Medicare Other | Admitting: Neurology

## 2019-09-20 ENCOUNTER — Encounter: Payer: Self-pay | Admitting: Neurology

## 2019-09-20 VITALS — BP 123/76 | HR 77 | Ht 74.0 in | Wt 218.0 lb

## 2019-09-20 DIAGNOSIS — G20 Parkinson's disease: Secondary | ICD-10-CM | POA: Diagnosis not present

## 2019-09-20 MED ORDER — CARBIDOPA-LEVODOPA-ENTACAPONE 37.5-150-200 MG PO TABS
1.0000 | ORAL_TABLET | Freq: Four times a day (QID) | ORAL | 6 refills | Status: DC
Start: 2019-09-20 — End: 2022-06-04

## 2019-09-20 MED ORDER — PRAMIPEXOLE DIHYDROCHLORIDE 0.25 MG PO TABS
0.5000 mg | ORAL_TABLET | Freq: Three times a day (TID) | ORAL | 6 refills | Status: DC
Start: 2019-09-20 — End: 2022-06-04

## 2019-09-20 NOTE — Progress Notes (Signed)
HISTORICAL  Randy [name] is a 64 year old male, accompanied by his sister Randy [name], seen in refer by  his primary care doctor Jamal Maes for evaluation of tremor, initial evaluation November 04 2016.  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  Laboratory evaluations on September 09 2016, creatinine 1.0, normal CBC hemoglobin of 13.2, cyclosporine level was 136,  MRI of the brain without contrast October 02 2016: Mild atrophy, supratentorium small vessel disease no acute abnormality.  UPDATE Jan 05 2017: Data scan showed clear loss of dopamine transporter protein in bilateral straight out, most severe in the putamen, but also involving the caudate head,  left greater than right in the typical pattern of Parkinson syndrome.  MRI of the brain in August 2018 showed mild atrophy, chronic small vessel disease. MRI of cervical spine in September 2018 showed mild spondylitic disease, there was no significant canal or foraminal narrowing He is diagnosed with idiopathic Parkinson's disease, responding well to Sinemet, He is now taking  Sinemet 25/100 mg at 8, noon, 7 PM,  UPDATE October 19 2017: He is accompanied by his sister at today's clinical visit, is taking Sinemet and 25/100 mg, take effect 20 minutes after empty stomach, and if it lasted 3 to 4 hours, early afternoon about 2-3 PM after second dose, he often feels exhausted, he denies difficulty turning in bed, he could not tolerate Requip due to GI side effect, dizziness.   UPDATE September 12 2018: He is taking Sinemet 25/100 mg 1 and half tablets 4 times a day at 8, 11, 15, 60, he noticed increased fatigue in the late afternoon especially 6 PM, he sleeps well, increase Sinemet from 1 to 1.5 tablets each time has made a difference, he noticed an dose wearing off, no dyskinesia  Update March 20, 2019: Follow-up for idiopathic Parkinson's disease,  He is overall doing well, tolerating Sinemet 25/100 mg 2 tablets, he takes it every 2-3 hours, note is wearing off bradykinesia, he did not reported dyskinesia, he is happy about current medication, wants to stay on current schedule  Update September 20, 2019: Follow up for Idiopathic Parkinson's disease, diagnosed since September 2018, was treated with titrating dose of Sinemet over the past few years,  He lives alone, helping his daughter taking care of his grandchildren 19month 630years old, walking around without much difficulty, keep going all day long, get up at 8am, go to bed 1030pm, his grandchildren keep him busy, he do not get a chance to take a nap, he feels better resolved taking Sinemet 25/100 empty stomach, 2 tablets at 8, 11, 2, 6 PM, he self added 8 PM dose, he gave good story of onset of benefit  30 minutes after he takes sinemet, wearing off after 2.5-3 hours, occasionally end dose dyskinesia movement, he also reported better result taking Sinemet on empty stomach  Previously tried Requip, complains of GI side effect dizziness, insurance would not cover extended release  His nephrologist is Dr. Posey Pronto at [facility],  he reported good report with normal kidney function recently  REVIEW OF SYSTEMS: Full 14 system review of systems performed and notable only for  As above.  ALLERGIES: No Known Allergies  HOME MEDICATIONS: Current Outpatient Medications  Medication Sig Dispense Refill  . amLODipine (NORVASC) 10 MG tablet Take 10 mg by mouth 2 (two) times daily.    Marland Kitchen atorvastatin (LIPITOR) 80 MG tablet Take 80 mg by mouth daily.    . carbidopa-levodopa (SINEMET IR) 25-100 MG tablet Take 1 tablet by mouth 3 (three) times daily. 90 tablet 6  . cloNIDine (CATAPRES) 0.3 MG tablet Take 0.3 mg by mouth 2 (two) times daily.    . cycloSPORINE modified (NEORAL) 100 MG capsule Take 125 mg by mouth 2 (two) times daily.     . enalapril (VASOTEC) 5 MG tablet Take 5 mg by mouth at bedtime.     . furosemide (LASIX) 40 MG tablet Take 40 mg by mouth daily.    Marland Kitchen glipiZIDE (GLUCOTROL XL) 5 MG 24 hr tablet Take 5 mg by mouth daily.    Marland Kitchen levothyroxine (SYNTHROID, LEVOTHROID) 137 MCG tablet Take 137 mcg by mouth daily before breakfast.    . metoprolol succinate (TOPROL-XL) 50 MG 24 hr tablet Take 1 tablet (50 mg total) by mouth daily. Take with or immediately following a meal. 90 tablet 3  . mycophenolate (MYFORTIC) 360 MG TBEC Take 720 mg by mouth 2 (two) times daily.     . predniSONE (DELTASONE) 5 MG tablet Take 5 mg by mouth daily.    Marland Kitchen warfarin (COUMADIN) 5 MG tablet USE AS DIRECTED BY COUMADIN CLINIC 30 tablet 3   No current facility-administered medications for this visit.     PAST MEDICAL HISTORY: Past Medical History:  Diagnosis Date  . Arthritis    knees  . Atrial fibrillation ([place])    dx 07/2012; CHADS2=2; coumadin started  . Chronic kidney disease    dialysis; kidney transplant  2004  . Diabetes ([place])   . Dyslipidemia   . Ejection fraction    a. Echo 7/14:  Mod LVH, EF 55-60%, Gr 2 DD, MAC, mild LAE, PASP 32, trivial Eff.  . Ejection fraction    .  Marland Kitchen History of verruca vulgaris   . Hypercalcemia   .  Hyperlipidemia   . Hyperparathyroidism ([place])   . Hypertension   . Multiple thyroid nodules   . PAF (paroxysmal atrial fibrillation) ([place])   . 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 Preop cardiovascular exam    Preop clearance for thyroid surgery June, 2013  . Thyroid cancer ([place])   . Vitamin D deficiency   . Vitiligo     PAST SURGICAL HISTORY: Past Surgical History:  Procedure Laterality Date  . KIDNEY TRANSPLANT  2004    FAMILY HISTORY: Family History  Problem Relation Age of Onset  . Dementia Mother   . Diabetes Mother   . Stroke Father   . Diabetes Father     SOCIAL HISTORY:  Social History   Socioeconomic History  . Marital status: Single    Spouse name: Not on file  . Number of children: 2  . Years of education: HS  . Highest education  level: Not on file  Social Needs  . Financial resource strain: Not on file  . Food insecurity - worry: Not on file  . Food insecurity - inability: Not on file  . Transportation needs - medical: Not on file  . Transportation needs - non-medical: Not on file  Occupational History  . Occupation: Disabled  Tobacco Use  . Smoking status: Never Smoker  . Smokeless tobacco: Never Used  Substance and Sexual Activity  . Alcohol use: No    Comment: no use since 2013  . Drug use: No    Comment: previous use - not since 1993  . Sexual activity: Not on file  Other Topics Concern  . Not on file  Social History Narrative   Lives at home alone.   Right-handed.   12 ounces of caffeine weekly.     PHYSICAL EXAM   Vitals:   01/05/17 1004  BP: 112/77  Pulse: 74  Weight: 168 lb (76.2 kg)  Height: _0  (1.88 m)    Not recorded      Body mass index is 21.57 kg/m.  PHYSICAL EXAMNIATION:  Gen: NAD, conversant, well nourised, obese, well groomed  NEUROLOGICAL EXAM:  MENTAL STATUS: Speech/cognition: Awake alert oriented to history taking and care of conversation   CRANIAL NERVES: CN II: Visual fields are full  to confrontation.  Pupils are round equal and briskly reactive to light. CN III, IV, VI: extraocular movement are normal. No ptosis. CN V: Facial sensation is intact to pinprick in all 3 divisions bilaterally. CN VII: Face is symmetric with normal eye closure and smile. CN VIII: Hearing is normal to rubbing fingers CN IX, X: Phonation is normal. CN XI: Head turning and shoulder shrug are intact   MOTOR: Mild left arm and leg resting tremor, mild left more than right rigidity, bradykinesia, no weakness.  REFLEXES: Reflexes are 3 and symmetric at the biceps, triceps, knees, and ankles. Plantar responses are extensor bilaterally.  SENSORY: Intact to light touch, pinprick, positional sensation and vibratory sensation are intact in fingers and toes.  COORDINATION: Rapid alternating movements and fine finger movements are intact. There is no dysmetria on finger-to-nose and heel-knee-shin.    GAIT/STANCE: He can get up from seated position arms crossed, steady, moderate stride, okay arm swing,  DIAGNOSTIC DATA (LABS, IMAGING, TESTING) - I reviewed patient records, labs, notes, testing and imaging myself where available.   ASSESSMENT AND PLAN  Kimi Kroft is a 64 y.o. male   Idiopathic Parkinson's disease  DatScan showed decreased dopamine transporter protein at bilateral putamen, also involving caudate head, left worse than right,  drop Sinemet 25/100 to 2 tablets four times a day at 8, 11, 2 PM, 6 PM instead of 5 times a day, then replaced it with Stalevo 150 mg 4 times a day, to avoid long-term high dopamine overstimulation,  Could not tolerate requip due to dizziness, GI side effect,has high copay for requip xr. We will try Mirapex 0.25 mg titrating to 2 tablets 3 times a day,  keep moderate exercise  Return to clinic in 4 months  Marcial Pacas, M.D. Ph.D.  [facility] [address], [place], [place] [address] Ph: [phone] Fax: [phone]  CC:  Jamal Maes, MD

## 2019-10-01 ENCOUNTER — Other Ambulatory Visit: Payer: Self-pay | Admitting: Cardiovascular Disease

## 2019-11-01 ENCOUNTER — Other Ambulatory Visit: Payer: Self-pay

## 2019-11-01 ENCOUNTER — Ambulatory Visit (INDEPENDENT_AMBULATORY_CARE_PROVIDER_SITE_OTHER): Payer: Medicare Other | Admitting: *Deleted

## 2019-11-01 DIAGNOSIS — Z5181 Encounter for therapeutic drug level monitoring: Secondary | ICD-10-CM

## 2019-11-01 DIAGNOSIS — I482 Chronic atrial fibrillation, unspecified: Secondary | ICD-10-CM

## 2019-11-01 LAB — POCT INR: INR: 2.4 (ref 2.0–3.0)

## 2019-11-01 NOTE — Patient Instructions (Signed)
Description   Continue on same dosage 1 tablet everyday except 1/2 tablet on Wednesdays and Fridays.  Recheck in 8 weeks. Call us with any medication changes or concerns #[phone] [facility], Main # [phone].     

## 2019-11-12 ENCOUNTER — Other Ambulatory Visit: Payer: Self-pay | Admitting: Neurology

## 2019-12-27 ENCOUNTER — Other Ambulatory Visit: Payer: Self-pay

## 2019-12-27 ENCOUNTER — Ambulatory Visit (INDEPENDENT_AMBULATORY_CARE_PROVIDER_SITE_OTHER): Payer: Medicare Other | Admitting: *Deleted

## 2019-12-27 DIAGNOSIS — I482 Chronic atrial fibrillation, unspecified: Secondary | ICD-10-CM | POA: Diagnosis not present

## 2019-12-27 DIAGNOSIS — Z5181 Encounter for therapeutic drug level monitoring: Secondary | ICD-10-CM | POA: Diagnosis not present

## 2019-12-27 LAB — POCT INR: INR: 2.7 (ref 2.0–3.0)

## 2019-12-27 NOTE — Patient Instructions (Signed)
Description   Continue on same dosage 1 tablet everyday except 1/2 tablet on Wednesdays and Fridays.  Recheck in 8 weeks. Call us with any medication changes or concerns [phone] [facility], Main # [phone].

## 2020-01-01 ENCOUNTER — Ambulatory Visit
Admission: EM | Admit: 2020-01-01 | Discharge: 2020-01-01 | Disposition: A | Discharge status: Home / Self Care | Payer: Medicare Other | Attending: Emergency Medicine | Admitting: Emergency Medicine

## 2020-01-01 DIAGNOSIS — N2581 Secondary hyperparathyroidism of renal origin: Secondary | ICD-10-CM | POA: Diagnosis not present

## 2020-01-01 DIAGNOSIS — R0781 Pleurodynia: Secondary | ICD-10-CM | POA: Diagnosis not present

## 2020-01-01 DIAGNOSIS — N189 Chronic kidney disease, unspecified: Secondary | ICD-10-CM | POA: Diagnosis not present

## 2020-01-01 DIAGNOSIS — D631 Anemia in chronic kidney disease: Secondary | ICD-10-CM | POA: Diagnosis not present

## 2020-01-01 DIAGNOSIS — I10 Essential (primary) hypertension: Secondary | ICD-10-CM | POA: Diagnosis not present

## 2020-01-01 DIAGNOSIS — Z940 Kidney transplant status: Secondary | ICD-10-CM | POA: Diagnosis not present

## 2020-01-01 MED ORDER — TIZANIDINE HCL 2 MG PO TABS: 2.0000 mg | ORAL_TABLET | Freq: Four times a day (QID) | ORAL | 0 refills | Status: AC | PRN

## 2020-01-01 NOTE — ED Triage Notes (Signed)
Pt states was lifting his granddaughter out of a shopping cart and felt a pop under lt breast/rib area. States pain worse when taking a deep breath.

## 2020-01-01 NOTE — Discharge Instructions (Signed)
RICE: rest, ice, compression, elevation as needed for pain.    Pain medication:  350 mg-1000 mg of Tylenol (acetaminophen) and/or 200 mg - 800 mg of Advil (ibuprofen, Motrin) every 8 hours as needed.  May alternate between the two throughout the day as they are generally safe to take together.  DO NOT exceed more than 3000 mg of Tylenol or 3200 mg of ibuprofen in a 24 hour period as this could damage your stomach, kidneys, liver, or increase your bleeding risk.  May take muscle relaxer as needed for severe pain / spasm.  (This medication may cause you to become tired so it is important you do not drink alcohol or operate heavy machinery while on this medication.  Recommend your first dose to be taken before bedtime to monitor for side effects safely)

## 2020-01-01 NOTE — ED Provider Notes (Signed)
[facility]    CSN: [id number] Arrival date & time: 01/01/20  1653      History   Chief Complaint Chief Complaint  Patient presents with  . rib pain    HPI Randy [name] is a 63 y.o. male  With extensive medical history as below presenting for left anterior rib pain s/p pulling his granddaughter out of a shopping cart. Denies fall, trauma to area. Felt a pop and has had pain that is worsened with movement, deep inspiration or cough. Denies shortness of breath, palpitation, lightheadedness or dizziness.  Denies h/o osteoporosis.  Past Medical History:  Diagnosis Date  . Arthritis    knees  . Atrial fibrillation ([place])    dx 07/2012; CHADS2=2; coumadin started  . Chronic kidney disease    dialysis; kidney transplant  2004  . Diabetes ([place])   . Dyslipidemia   . Ejection fraction    a. Echo 7/14:  Mod LVH, EF 55-60%, Gr 2 DD, MAC, mild LAE, PASP 32, trivial Eff.  . Ejection fraction    .  Marland Kitchen History of verruca vulgaris   . Hypercalcemia   . Hyperlipidemia   . Hyperparathyroidism ([place])   . Hypertension   . Multiple thyroid nodules   . PAF (paroxysmal atrial fibrillation) ([place])   . 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 Preop cardiovascular exam    Preop clearance for thyroid surgery June, 2013  . Thyroid cancer ([place])   . Vitamin D deficiency   . Vitiligo     Patient Active Problem List   Diagnosis Date Noted  . Parkinson's disease ([place]) 03/20/2019  . Parkinsonism ([place]) 11/04/2016  . Gait abnormality 11/04/2016  . Hypothyroidism (acquired) 05/16/2013  . Encounter for therapeutic drug monitoring 03/23/2013  . Warfarin anticoagulation 10/01/2012  . Ejection fraction   . Atrial fibrillation ([place]) 08/08/2012  . Thyroid cancer ([place]) 08/27/2011  . Chronic kidney disease   . Hypertension     Past Surgical History:  Procedure Laterality Date  . KIDNEY TRANSPLANT  2004  . THYROIDECTOMY  08/17/2011   Procedure: THYROIDECTOMY;  Surgeon: Earnstine Regal,  MD;  Location: [facility];  Service: General;  Laterality: N/A;       Home Medications    Prior to Admission medications   Medication Sig Start Date End Date Taking? Authorizing Provider  amLODipine (NORVASC) 10 MG tablet Take 10 mg by mouth 2 (two) times daily.    [provider]  atorvastatin (LIPITOR) 80 MG tablet Take 80 mg by mouth daily.    [provider]  carbidopa-levodopa (SINEMET IR) 25-100 MG tablet TAKE 2 TABLETS BY MOUTH 4 TIMES DAILY 11/12/19   Marcial Pacas, MD  carbidopa-levodopa-entacapone (STALEVO 150) 37.5-150-200 MG tablet Take 1 tablet by mouth 4 (four) times daily. At 8, 11, 2pm, 6pm 09/20/19   Marcial Pacas, MD  cloNIDine (CATAPRES) 0.3 MG tablet Take 0.3 mg by mouth 2 (two) times daily.    [provider]  cycloSPORINE modified (NEORAL) 100 MG capsule Take 125 mg by mouth 2 (two) times daily.     [provider]  furosemide (LASIX) 40 MG tablet Take 40 mg by mouth daily.    [provider]  glipiZIDE (GLUCOTROL XL) 5 MG 24 hr tablet Take 5 mg by mouth daily.    [provider]  metoprolol succinate (TOPROL-XL) 50 MG 24 hr tablet Take 1 tablet (50 mg total) by mouth daily. Take with or immediately following a meal. 03/17/15   Nishan,  Wallis Bamberg, MD  mycophenolate (MYFORTIC) 360 MG TBEC Take 720 mg by mouth 2 (two) times daily.     [provider]  pramipexole (MIRAPEX) 0.25 MG tablet Take 2 tablets (0.5 mg total) by mouth 3 (three) times daily. 09/20/19   Marcial Pacas, MD  predniSONE (DELTASONE) 5 MG tablet Take 5 mg by mouth daily.    [provider]  SYNTHROID 137 MCG tablet Take 137 mcg by mouth every morning. 05/28/19   [provider]  tiZANidine (ZANAFLEX) 2 MG tablet Take 1 tablet (2 mg total) by mouth every 6 (six) hours as needed for up to 5 days for muscle spasms. 01/01/20 01/06/20  Hall-Potvin, [place], PA-C  warfarin (COUMADIN) 5 MG tablet TAKE 1/2 TO 1 (ONE-HALF TO ONE) TABLET BY MOUTH ONCE DAILY  AS DIRECTED BY  COUMADIN  CLINIC 10/02/19   Josue Hector, MD    Family History Family History  Problem Relation Age of Onset  . Dementia Mother   . Diabetes Mother   . Stroke Father   . Diabetes Father     Social History Social History   Tobacco Use  . Smoking status: Never Smoker  . Smokeless tobacco: Never Used  Vaping Use  . Vaping Use: Never used  Substance Use Topics  . Alcohol use: No    Comment: no use since 2013  . Drug use: No    Types: Marijuana    Comment: previous use - not since 1993     Allergies   Patient has no known allergies.   Review of Systems Review of Systems  Constitutional: Negative for fatigue and fever.  Respiratory: Negative for cough and shortness of breath.   Cardiovascular: Positive for chest pain. Negative for palpitations.  Gastrointestinal: Negative for abdominal pain, diarrhea and vomiting.  Musculoskeletal: Negative for arthralgias and myalgias.  Skin: Negative for rash and wound.  Neurological: Negative for speech difficulty and headaches.  All other systems reviewed and are negative.    Physical Exam Triage Vital Signs ED Triage Vitals  Enc Vitals Group     BP      Pulse      Resp      Temp      Temp src      SpO2      Weight      Height      Head Circumference      Peak Flow      Pain Score      Pain Loc      Pain Edu?      Excl. in [employer]?    No data found.  Updated Vital Signs BP (!) 162/97 (BP Location: Left Arm)   Pulse 91   Temp 98.3 F (36.8 C) (Oral)   Resp 18   SpO2 97%   Visual Acuity Right Eye Distance:   Left Eye Distance:   Bilateral Distance:    Right Eye Near:   Left Eye Near:    Bilateral Near:     Physical Exam Constitutional:      General: He is not in acute distress. HENT:     Head: Normocephalic and atraumatic.  Eyes:     General: No scleral icterus.    Pupils: Pupils are equal, round, and reactive to light.  Cardiovascular:     Rate and Rhythm: Normal rate. Rhythm  irregular.  Pulmonary:     Effort: Pulmonary effort is normal. No respiratory distress.     Breath sounds: No wheezing  or rales.  Chest:    Skin:    Coloration: Skin is not jaundiced or pale.  Neurological:     Mental Status: He is alert and oriented to person, place, and time.      UC Treatments / Results  Labs (all labs ordered are listed, but only abnormal results are displayed) Labs Reviewed - No data to display  EKG   Radiology No results found.  Procedures Procedures (including critical care time)  Medications Ordered in UC Medications - No data to display  Initial Impression / Assessment and Plan / UC Course  I have reviewed the triage vital signs and the nursing notes.  Pertinent labs & imaging results that were available during my care of the patient were reviewed by me and considered in my medical decision making (see chart for details).     Low concern for fracture at this time. Will treat supportively as below, follow-up with PCP or specialist for further evaluation and management if needed.  Return precautions discussed, pt verbalized understanding and is agreeable to plan. Final Clinical Impressions(s) / UC Diagnoses   Final diagnoses:  Rib pain on left side     Discharge Instructions     RICE: rest, ice, compression, elevation as needed for pain.    Pain medication:  350 mg-1000 mg of Tylenol (acetaminophen) and/or 200 mg - 800 mg of Advil (ibuprofen, Motrin) every 8 hours as needed.  May alternate between the two throughout the day as they are generally safe to take together.  DO NOT exceed more than 3000 mg of Tylenol or 3200 mg of ibuprofen in a 24 hour period as this could damage your stomach, kidneys, liver, or increase your bleeding risk.  May take muscle relaxer as needed for severe pain / spasm.  (This medication may cause you to become tired so it is important you do not drink alcohol or operate heavy machinery while on this medication.   Recommend your first dose to be taken before bedtime to monitor for side effects safely)    ED Prescriptions    Medication Sig Dispense Auth. Provider   tiZANidine (ZANAFLEX) 2 MG tablet Take 1 tablet (2 mg total) by mouth every 6 (six) hours as needed for up to 5 days for muscle spasms. 20 tablet Hall-Potvin, [place], PA-C     PDMP not reviewed this encounter.   Neldon Mc Bluford, [place] 01/01/20 1850

## 2020-01-07 DIAGNOSIS — Z23 Encounter for immunization: Secondary | ICD-10-CM | POA: Diagnosis not present

## 2020-01-21 ENCOUNTER — Encounter: Payer: Self-pay | Admitting: Neurology

## 2020-01-21 ENCOUNTER — Telehealth: Payer: Self-pay | Admitting: *Deleted

## 2020-01-21 ENCOUNTER — Ambulatory Visit: Payer: Medicare Other | Admitting: Neurology

## 2020-01-21 NOTE — Telephone Encounter (Signed)
No showed follow up appointment. 

## 2020-01-31 ENCOUNTER — Other Ambulatory Visit: Payer: Self-pay | Admitting: Cardiovascular Disease

## 2020-02-21 ENCOUNTER — Ambulatory Visit (INDEPENDENT_AMBULATORY_CARE_PROVIDER_SITE_OTHER): Payer: Medicare Other | Admitting: *Deleted

## 2020-02-21 ENCOUNTER — Other Ambulatory Visit: Payer: Self-pay

## 2020-02-21 DIAGNOSIS — Z5181 Encounter for therapeutic drug level monitoring: Secondary | ICD-10-CM | POA: Diagnosis not present

## 2020-02-21 DIAGNOSIS — I482 Chronic atrial fibrillation, unspecified: Secondary | ICD-10-CM

## 2020-02-21 LAB — POCT INR: INR: 1.6 — AB (ref 2.0–3.0)

## 2020-02-21 NOTE — Patient Instructions (Signed)
Description   Today take 1.5 tablets then continue taking Warfarin 1 tablet everyday except 1/2 tablet on Wednesdays and Fridays.  Recheck in 4 weeks (normally 8 weeks). Call us with any medication changes or concerns #[phone] Cou[facility]in Clinic, Main # [phone].

## 2020-03-20 ENCOUNTER — Telehealth: Payer: Self-pay | Admitting: *Deleted

## 2020-03-20 NOTE — Telephone Encounter (Signed)
Called pt since he missed his Anticoagulation appt this morning at 11am; left a message for pt to call back. Will await.

## 2020-03-22 ENCOUNTER — Telehealth: Payer: Self-pay | Admitting: Physician Assistant

## 2020-03-25 NOTE — Telephone Encounter (Signed)
   Received weekend page from [place] that the patient had passed away. When I returned call they reported they already got the information they needed, trying to determine if the patient had previously seen a physician, and they no longer need our assistance. Will send updated note to anticoag clinic and Dr. Johnsie Cancel just as Juluis Rainier.  Dd.em

## 2020-03-25 DEATH — deceased

## 2020-03-26 ENCOUNTER — Telehealth: Payer: Self-pay | Admitting: Cardiovascular Disease

## 2020-03-26 NOTE — Telephone Encounter (Signed)
I called Randy [name] to find out if they were going to pick up the death certificate they dropped off. The representative said that since Dr. Johnsie Cancel did not sign the death certificate, we should just shred it. So I did.
